# Patient Record
Sex: Female | Born: 1990
Health system: Southern US, Community
[De-identification: ages and names within clinical notes are randomized; demographics above are authoritative.]

---

## 2010-12-27 ENCOUNTER — Encounter: Payer: Self-pay | Admitting: Obstetrics and Gynecology

## 2012-05-08 ENCOUNTER — Other Ambulatory Visit: Payer: Self-pay

## 2012-08-30 ENCOUNTER — Other Ambulatory Visit (HOSPITAL_COMMUNITY): Payer: Self-pay | Admitting: Obstetrics and Gynecology

## 2012-08-30 DIAGNOSIS — IMO0002 Reserved for concepts with insufficient information to code with codable children: Secondary | ICD-10-CM

## 2012-08-31 ENCOUNTER — Ambulatory Visit (HOSPITAL_COMMUNITY): Payer: Self-pay | Attending: Obstetrics and Gynecology

## 2012-09-04 ENCOUNTER — Other Ambulatory Visit (HOSPITAL_COMMUNITY): Payer: Self-pay | Admitting: Obstetrics and Gynecology

## 2012-09-04 DIAGNOSIS — O368390 Maternal care for abnormalities of the fetal heart rate or rhythm, unspecified trimester, not applicable or unspecified: Secondary | ICD-10-CM

## 2012-09-06 ENCOUNTER — Encounter (HOSPITAL_COMMUNITY): Payer: Self-pay

## 2012-09-06 ENCOUNTER — Ambulatory Visit (HOSPITAL_COMMUNITY)
Admission: RE | Admit: 2012-09-06 | Discharge: 2012-09-06 | Disposition: A | Discharge status: Home / Self Care | Payer: Medicaid Other | Source: Ambulatory Visit | Attending: Obstetrics and Gynecology | Admitting: Obstetrics and Gynecology

## 2012-09-06 DIAGNOSIS — Z363 Encounter for antenatal screening for malformations: Secondary | ICD-10-CM | POA: Insufficient documentation

## 2012-09-06 DIAGNOSIS — Z1389 Encounter for screening for other disorder: Secondary | ICD-10-CM | POA: Insufficient documentation

## 2012-09-06 DIAGNOSIS — O368390 Maternal care for abnormalities of the fetal heart rate or rhythm, unspecified trimester, not applicable or unspecified: Secondary | ICD-10-CM

## 2012-09-06 DIAGNOSIS — O358XX0 Maternal care for other (suspected) fetal abnormality and damage, not applicable or unspecified: Secondary | ICD-10-CM | POA: Insufficient documentation

## 2012-09-06 NOTE — Progress Notes (Signed)
Ms. Linda Webb had an ultrasound appointment today.  Please see [facility]-OB/GYN report for details.  Comments  There is an active singleton fetus with no apparent structural defects or markers of aneuploidy on today's routine anatomic examination.  The biometry suggests a fetus with an EFW at the approximately 45th percentile for gestational age.   The patient is referred for evaluation of skipped heart beat heard on doptone in the office.   M-mode echocardiography was performed.    The fetal heart rate pattern on M-mode reveals intermittent premature atrial contractions. Premature atrial contractions (PAC's) are the most common fetal arrhythmia and are usually self-limited. Most resolve by the time of delivery and rarely progress to cause problems in the neonate. PACs are generally not associated with cardiac structural defects.  We discussed this is most often a benign finding which resolves prior to delivery, but rarely these can lead to SVT which is associated with decreased fetal movement. Therefore, we recommend the patient continue with kick counts and notify you if there is decreased fetal movement.  Clinical surveillance via fetal heart rate auscultation (Doptone) is recommended on a weekly basis.   Impression Active singleton fetus. Premature atrial contractions (PACs) Normal interval growth. Normal amniotic fluid volume  Recommendations 1. I recommend follow-up with Doptone of the fetal heart rate in the office at weekly prenatal visits. 2. Follow as clinically indicated with routine fetal kick counts. 3. Referral to MFM with FHR exceeding 170 bpm.  Marjo Bicker, Louann Sjogren, MD, MS, FACOG Assistant Professor Section of Maternal-Fetal Medicine [facility]

## 2014-10-07 ENCOUNTER — Encounter (HOSPITAL_COMMUNITY): Payer: Self-pay

## 2014-11-14 ENCOUNTER — Emergency Department (HOSPITAL_BASED_OUTPATIENT_CLINIC_OR_DEPARTMENT_OTHER): Payer: Medicaid Other

## 2014-11-14 ENCOUNTER — Encounter (HOSPITAL_BASED_OUTPATIENT_CLINIC_OR_DEPARTMENT_OTHER): Payer: Self-pay | Admitting: *Deleted

## 2014-11-14 ENCOUNTER — Other Ambulatory Visit (HOSPITAL_BASED_OUTPATIENT_CLINIC_OR_DEPARTMENT_OTHER): Payer: Medicaid Other

## 2014-11-14 ENCOUNTER — Emergency Department (HOSPITAL_BASED_OUTPATIENT_CLINIC_OR_DEPARTMENT_OTHER)
Admission: EM | Admit: 2014-11-14 | Discharge: 2014-11-14 | Disposition: A | Discharge status: Home / Self Care | Payer: Medicaid Other | Attending: Emergency Medicine | Admitting: Emergency Medicine

## 2014-11-14 DIAGNOSIS — B373 Candidiasis of vulva and vagina: Secondary | ICD-10-CM | POA: Diagnosis not present

## 2014-11-14 DIAGNOSIS — O2391 Unspecified genitourinary tract infection in pregnancy, first trimester: Secondary | ICD-10-CM | POA: Diagnosis not present

## 2014-11-14 DIAGNOSIS — Z3A01 Less than 8 weeks gestation of pregnancy: Secondary | ICD-10-CM | POA: Insufficient documentation

## 2014-11-14 DIAGNOSIS — N390 Urinary tract infection, site not specified: Secondary | ICD-10-CM

## 2014-11-14 DIAGNOSIS — R102 Pelvic and perineal pain: Secondary | ICD-10-CM

## 2014-11-14 DIAGNOSIS — R109 Unspecified abdominal pain: Secondary | ICD-10-CM

## 2014-11-14 DIAGNOSIS — B3731 Acute candidiasis of vulva and vagina: Secondary | ICD-10-CM

## 2014-11-14 DIAGNOSIS — O26891 Other specified pregnancy related conditions, first trimester: Secondary | ICD-10-CM

## 2014-11-14 DIAGNOSIS — Z3490 Encounter for supervision of normal pregnancy, unspecified, unspecified trimester: Secondary | ICD-10-CM

## 2014-11-14 DIAGNOSIS — O9989 Other specified diseases and conditions complicating pregnancy, childbirth and the puerperium: Secondary | ICD-10-CM | POA: Insufficient documentation

## 2014-11-14 LAB — BASIC METABOLIC PANEL
Anion gap: 15 (ref 5–15)
BUN: 13 mg/dL (ref 6–23)
CALCIUM: 8.7 mg/dL (ref 8.4–10.5)
CO2: 20 meq/L (ref 19–32)
Chloride: 102 mEq/L (ref 96–112)
Creatinine, Ser: 0.6 mg/dL (ref 0.50–1.10)
GFR calc Af Amer: >90 mL/min (ref >90)
GFR calc non Af Amer: >90 mL/min (ref >90)
Glucose, Bld: 77 mg/dL (ref 70–99)
Potassium: 3.9 mEq/L (ref 3.7–5.3)
SODIUM: 137 meq/L (ref 137–147)

## 2014-11-14 LAB — PREGNANCY, URINE: PREG TEST UR: POSITIVE — AB

## 2014-11-14 LAB — CBC WITH DIFFERENTIAL/PLATELET
Basophils Absolute: 0 10*3/uL (ref 0.0–0.1)
Basophils Relative: 0 % (ref 0–1)
EOS PCT: 1 % (ref 0–5)
Eosinophils Absolute: 0.1 10*3/uL (ref 0.0–0.7)
HEMATOCRIT: 29.9 % — AB (ref 36.0–46.0)
Hemoglobin: 9.4 g/dL — ABNORMAL LOW (ref 12.0–15.0)
LYMPHS PCT: 39 % (ref 12–46)
Lymphs Abs: 3.1 10*3/uL (ref 0.7–4.0)
MCH: 23.5 pg — ABNORMAL LOW (ref 26.0–34.0)
MCHC: 31.4 g/dL (ref 30.0–36.0)
MCV: 74.8 fL — AB (ref 78.0–100.0)
MONO ABS: 0.8 10*3/uL (ref 0.1–1.0)
Monocytes Relative: 10 % (ref 3–12)
Neutro Abs: 4 10*3/uL (ref 1.7–7.7)
Neutrophils Relative %: 50 % (ref 43–77)
Platelets: 214 10*3/uL (ref 150–400)
RBC: 4 MIL/uL (ref 3.87–5.11)
RDW: 16.9 % — ABNORMAL HIGH (ref 11.5–15.5)
WBC: 8 10*3/uL (ref 4.0–10.5)

## 2014-11-14 LAB — HCG, QUANTITATIVE, PREGNANCY: HCG, BETA CHAIN, QUANT, S: 34518 m[IU]/mL — AB (ref <5)

## 2014-11-14 LAB — URINE MICROSCOPIC-ADD ON

## 2014-11-14 LAB — WET PREP, GENITAL: Trich, Wet Prep: NONE SEEN

## 2014-11-14 LAB — URINALYSIS, ROUTINE W REFLEX MICROSCOPIC
Glucose, UA: NEGATIVE mg/dL
Hgb urine dipstick: NEGATIVE
KETONES UR: 15 mg/dL — AB
NITRITE: NEGATIVE
Protein, ur: NEGATIVE mg/dL
Specific Gravity, Urine: 1.029 (ref 1.005–1.030)
UROBILINOGEN UA: 1 mg/dL (ref 0.0–1.0)
pH: 6 (ref 5.0–8.0)

## 2014-11-14 MED ORDER — PRENATAL COMPLETE 14-0.4 MG PO TABS: 1.0000 | ORAL_TABLET | Freq: Every day | ORAL | Status: DC

## 2014-11-14 MED ORDER — CEPHALEXIN 500 MG PO CAPS: 500.0000 mg | ORAL_CAPSULE | Freq: Four times a day (QID) | ORAL | Status: DC

## 2014-11-14 MED ORDER — MICONAZOLE NITRATE 2 % EX CREA
1.0000 | TOPICAL_CREAM | Freq: Two times a day (BID) | CUTANEOUS | Status: DC
Start: 2014-11-14 — End: 2019-07-13

## 2014-11-14 NOTE — ED Provider Notes (Signed)
CSN: [id number]     Arrival date & time 11/14/14  1937 History   First MD Initiated Contact with Patient 11/14/14 2028     Chief Complaint  Patient presents with  . Abdominal Pain     (Consider location/radiation/quality/duration/timing/severity/associated sxs/prior Treatment) HPI Comments: Patient is a 23 year old female with no past medical history who presents with abdominal pain that started a few hours ago. The pain is located in her lower abdomen and does not radiate. The pain is described as aching and moderate. The pain started gradually and progressively worsened since the onset. No alleviating/aggravating factors. The patient has tried nothing for symptoms without relief. Associated symptoms include nothing. Patient denies fever, headache, NVD, chest pain, SOB, dysuria, constipation, abnormal vaginal bleeding/discharge. LMP 2 months ago.      History reviewed. No pertinent past medical history. History reviewed. No pertinent past surgical history. No family history on file. History  Substance Use Topics  . Smoking status: Never Smoker   . Smokeless tobacco: Never Used  . Alcohol Use: No   OB History    Gravida Para Term Preterm AB TAB SAB Ectopic Multiple Living   3 2 0 0 0 0 0 0 0 2      Review of Systems  Gastrointestinal: Positive for abdominal pain.  All other systems reviewed and are negative.     Allergies  Review of patient's allergies indicates no known allergies.  Home Medications   Prior to Admission medications   Medication Sig Start Date End Date Taking? Authorizing Provider  Prenatal Vit w/Fe-Methylfol-FA (PNV PO) Take by mouth.    Historical Provider, MD   BP 113/77 mmHg  Pulse 92  Temp(Src) 98.4 F (36.9 C) (Oral)  Resp 18  Ht 5\' 5"  (1.651 m)  Wt 120 lb (54.432 kg)  BMI 19.97 kg/m2  SpO2 99%  LMP 09/16/2014  Breastfeeding? Unknown Physical Exam  Constitutional: She is oriented to person, place, and time. She appears well-developed and  well-nourished. No distress.  HENT:  Head: Normocephalic and atraumatic.  Eyes: Conjunctivae are normal.  Neck: Normal range of motion.  Cardiovascular: Normal rate and regular rhythm.  Exam reveals no gallop and no friction rub.   No murmur heard. Pulmonary/Chest: Effort normal and breath sounds normal. She has no wheezes. She has no rales. She exhibits no tenderness.  Abdominal: Soft. She exhibits no distension. There is tenderness. There is no rebound and no guarding.  Lower central abdominal tenderness to palpation. No other focal tenderness.   Genitourinary: Vagina normal.  Copious thick white vaginal discharge. Cervical os closed. No CMT. Central tenderness to palpation on bimanual exam. No adnexal tenderness.   Musculoskeletal: Normal range of motion.  Neurological: She is alert and oriented to person, place, and time. Coordination normal.  Speech is goal-oriented. Moves limbs without ataxia.   Skin: Skin is warm and dry.  Psychiatric: She has a normal mood and affect. Her behavior is normal.  Nursing note and vitals reviewed.   ED Course  Procedures (including critical care time) Labs Review Labs Reviewed  WET PREP, GENITAL - Abnormal; Notable for the following:    Yeast Wet Prep HPF POC MODERATE (*)    Clue Cells Wet Prep HPF POC FEW (*)    WBC, Wet Prep HPF POC MODERATE (*)    All other components within normal limits  PREGNANCY, URINE - Abnormal; Notable for the following:    Preg Test, Ur POSITIVE (*)    All other components within normal  limits  URINALYSIS, ROUTINE W REFLEX MICROSCOPIC - Abnormal; Notable for the following:    Bilirubin Urine SMALL (*)    Ketones, ur 15 (*)    Leukocytes, UA SMALL (*)    All other components within normal limits  URINE MICROSCOPIC-ADD ON - Abnormal; Notable for the following:    Bacteria, UA MANY (*)    All other components within normal limits  CBC WITH DIFFERENTIAL - Abnormal; Notable for the following:    Hemoglobin 9.4 (*)     HCT 29.9 (*)    MCV 74.8 (*)    MCH 23.5 (*)    RDW 16.9 (*)    All other components within normal limits  HCG, QUANTITATIVE, PREGNANCY - Abnormal; Notable for the following:    hCG, Beta Chain, Quant, S [id number] (*)    All other components within normal limits  GC/CHLAMYDIA PROBE AMP  BASIC METABOLIC PANEL    Imaging Review [place] Ob Comp Less 14 Wks  11/14/2014   CLINICAL DATA:  Central pelvic pain for 16 hr. Estimated gestational age by LMP is 8 weeks 3 days. Quantitative beta HCG is not available.  EXAM: OBSTETRIC <14 WK ULTRASOUND  TECHNIQUE: Transabdominal ultrasound was performed for evaluation of the gestation as well as the maternal uterus and adnexal regions.  COMPARISON:  None.  FINDINGS: Intrauterine gestational sac: Single intrauterine pregnancy identified.  Yolk sac:  Yolk sac is present.  Embryo:  Fetal pole is identified.  Cardiac Activity: Fetal cardiac activity is visualized.  Heart Rate: 118 bpm  CRL:   6  mm   6 w 3 d                  US EDC: 07/05/2014  Maternal uterus/adnexae: The uterus appears anteverted. No myometrial mass lesions identified. Crescentic fluid collection adjacent to the inferior gestational sac consistent with small to moderate subchorionic hemorrhage. Both ovaries are visualized and appear normal. No abnormal adnexal masses. No free pelvic fluid collections.  IMPRESSION: Single living intrauterine pregnancy. Estimated gestational age by crown-rump length is 6 weeks 3 days. Small to moderate-sized subchorionic hemorrhage.   Electronically Signed   By: Burman NievesWilliam  Stevens M.D.   On: 11/14/2014 22:05     EKG Interpretation None      MDM   Final diagnoses:  Pregnancy  Abdominal pain  Vaginal yeast infection  UTI (lower urinary tract infection)    10:02 PM Urine shows positive pregnancy. Urinalysis shows UTI. Remaining labs unremarkable for acute changes. HCG 34,518. US ob pending.   US shows 1 IUP with small subchorionic hemorrhage. Patient advised to  [facility] for hcg recheck. Vitals stable and patient afebrile. Patient will have keflex for UTI.    Emilia BeckKaitlyn Lillee Mooneyhan, PA-C 11/15/14 1708  Arby BarretteMarcy Pfeiffer, MD 11/19/14 1106

## 2014-11-14 NOTE — ED Notes (Signed)
Mid abd pain since 0430- denies vaginal and urinary sx- last BM on Tuesday

## 2014-11-14 NOTE — Discharge Instructions (Signed)
Take prenatal vitamins daily. Use miconazole cream around the vagina area but do not put inside the vagina. [facility] for a recheck. Your pregnancy hormone in your blood is currently measured at 34, 518. [facility] with worsening or concerning symptoms.

## 2014-11-14 NOTE — ED Notes (Signed)
Patient transported to u/s via wheelchair per tech.

## 2014-11-15 LAB — GC/CHLAMYDIA PROBE AMP
CT Probe RNA: NEGATIVE
GC Probe RNA: NEGATIVE

## 2015-10-27 ENCOUNTER — Emergency Department (HOSPITAL_BASED_OUTPATIENT_CLINIC_OR_DEPARTMENT_OTHER)
Admission: EM | Admit: 2015-10-27 | Discharge: 2015-10-27 | Disposition: A | Discharge status: Home / Self Care | Payer: No Typology Code available for payment source | Attending: Emergency Medicine | Admitting: Emergency Medicine

## 2015-10-27 ENCOUNTER — Encounter (HOSPITAL_BASED_OUTPATIENT_CLINIC_OR_DEPARTMENT_OTHER): Payer: Self-pay | Admitting: Emergency Medicine

## 2015-10-27 DIAGNOSIS — Z792 Long term (current) use of antibiotics: Secondary | ICD-10-CM | POA: Insufficient documentation

## 2015-10-27 DIAGNOSIS — Z79899 Other long term (current) drug therapy: Secondary | ICD-10-CM | POA: Diagnosis not present

## 2015-10-27 DIAGNOSIS — Y998 Other external cause status: Secondary | ICD-10-CM | POA: Diagnosis not present

## 2015-10-27 DIAGNOSIS — Y92410 Unspecified street and highway as the place of occurrence of the external cause: Secondary | ICD-10-CM | POA: Insufficient documentation

## 2015-10-27 DIAGNOSIS — S29001A Unspecified injury of muscle and tendon of front wall of thorax, initial encounter: Secondary | ICD-10-CM | POA: Insufficient documentation

## 2015-10-27 DIAGNOSIS — Y9389 Activity, other specified: Secondary | ICD-10-CM | POA: Insufficient documentation

## 2015-10-27 NOTE — Discharge Instructions (Signed)
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
## 2016-12-16 ENCOUNTER — Emergency Department (HOSPITAL_BASED_OUTPATIENT_CLINIC_OR_DEPARTMENT_OTHER)
Admission: EM | Admit: 2016-12-16 | Discharge: 2016-12-17 | Disposition: A | Discharge status: Home / Self Care | Payer: Medicaid Other | Attending: Emergency Medicine | Admitting: Emergency Medicine

## 2016-12-16 ENCOUNTER — Encounter (HOSPITAL_BASED_OUTPATIENT_CLINIC_OR_DEPARTMENT_OTHER): Payer: Self-pay | Admitting: *Deleted

## 2016-12-16 DIAGNOSIS — Z79899 Other long term (current) drug therapy: Secondary | ICD-10-CM | POA: Insufficient documentation

## 2016-12-16 DIAGNOSIS — A599 Trichomoniasis, unspecified: Secondary | ICD-10-CM

## 2016-12-16 DIAGNOSIS — O26891 Other specified pregnancy related conditions, first trimester: Secondary | ICD-10-CM

## 2016-12-16 DIAGNOSIS — B9689 Other specified bacterial agents as the cause of diseases classified elsewhere: Secondary | ICD-10-CM

## 2016-12-16 DIAGNOSIS — A5901 Trichomonal vulvovaginitis: Secondary | ICD-10-CM | POA: Insufficient documentation

## 2016-12-16 DIAGNOSIS — N760 Acute vaginitis: Secondary | ICD-10-CM

## 2016-12-16 DIAGNOSIS — Z3A09 9 weeks gestation of pregnancy: Secondary | ICD-10-CM | POA: Insufficient documentation

## 2016-12-16 DIAGNOSIS — R109 Unspecified abdominal pain: Secondary | ICD-10-CM

## 2016-12-16 LAB — URINALYSIS, ROUTINE W REFLEX MICROSCOPIC
Glucose, UA: NEGATIVE mg/dL
Hgb urine dipstick: NEGATIVE
Ketones, ur: NEGATIVE mg/dL
NITRITE: NEGATIVE
Protein, ur: NEGATIVE mg/dL
Specific Gravity, Urine: 1.028 (ref 1.005–1.030)
pH: 6 (ref 5.0–8.0)

## 2016-12-16 LAB — PREGNANCY, URINE: PREG TEST UR: POSITIVE — AB

## 2016-12-16 LAB — URINALYSIS, MICROSCOPIC (REFLEX)

## 2016-12-16 NOTE — ED Provider Notes (Signed)
[facility] Provider Note   CSN: [id number] Arrival date & time: 12/16/16  2224  By signing my name below, I, Alyssa Grove, attest that this documentation has been prepared under the direction and in the presence of Melene Plan, DO. Electronically Signed: Alyssa Grove, ED Scribe. 12/16/16. 12:52 AM.   History   Chief Complaint Chief Complaint  Patient presents with  . Abdominal Pain    She is unaware of LNMP, but believes it was either 10/2016 or 12/ 2017. Pt had a positive home pregnancy test. Pt has been pregnant 5x before with all vaginal deliveries with no complications.    The history is provided by the patient. No language interpreter was used.  Abdominal Pain   This is a new problem. Episode onset: 4 days. The problem occurs constantly. The problem has not changed since onset.The pain is located in the suprapubic region. The quality of the pain is sharp. The pain is moderate. Pertinent negatives include fever, nausea, vomiting, dysuria, hematuria, headaches, arthralgias and myalgias.   History reviewed. No pertinent past medical history.  There are no active problems to display for this patient.  History reviewed. No pertinent surgical history.  OB History    Gravida Para Term Preterm AB Living   3 2 0 0 0 2   SAB TAB Ectopic Multiple Live Births   0 0 0 0        Home Medications    Prior to Admission medications   Medication Sig Start Date End Date Taking? Authorizing Provider  cephALEXin (KEFLEX) 500 MG capsule Take 1 capsule (500 mg total) by mouth 4 (four) times daily. 11/14/14   Kaitlyn Szekalski, PA-C  metroNIDAZOLE (FLAGYL) 500 MG tablet Take 1 tablet (500 mg total) by mouth 2 (two) times daily. One po bid x 7 days 12/17/16   Melene Plan, DO  miconazole (MICATIN) 2 % cream Apply 1 application topically 2 (two) times daily. 11/14/14   Emilia Beck, PA-C  Prenatal Vit w/Fe-Methylfol-FA (PNV PO) Take by mouth.    Historical Provider, MD  Prenatal  Vit-Fe Fumarate-FA (PRENATAL COMPLETE) 14-0.4 MG TABS Take 1 tablet by mouth daily. 11/14/14   Emilia Beck, PA-C    Family History No family history on file.  Social History Social History  Substance Use Topics  . Smoking status: Never Smoker  . Smokeless tobacco: Never Used  . Alcohol use No     Allergies   Patient has no known allergies.   Review of Systems Review of Systems  Constitutional: Negative for chills and fever.  HENT: Negative for congestion and rhinorrhea.   Eyes: Negative for redness and visual disturbance.  Respiratory: Negative for shortness of breath and wheezing.   Cardiovascular: Negative for chest pain and palpitations.  Gastrointestinal: Positive for abdominal pain. Negative for nausea and vomiting.  Genitourinary: Negative for difficulty urinating, dysuria, hematuria, urgency, vaginal bleeding and vaginal discharge.  Musculoskeletal: Negative for arthralgias and myalgias.  Skin: Negative for pallor and wound.  Neurological: Negative for dizziness and headaches.  All other systems reviewed and are negative.  Physical Exam Updated Vital Signs BP 108/74   Pulse 78   Temp 97.6 F (36.4 C) (Oral)   Resp 18   Ht 5\' 5"  (1.651 m)   Wt 114 lb (51.7 kg)   LMP 10/06/2016   SpO2 100%   BMI 18.97 kg/m   Physical Exam  Constitutional: She is oriented to person, place, and time. She appears well-developed and well-nourished. She is active. No  distress.  HENT:  Head: Normocephalic and atraumatic.  Eyes: Conjunctivae are normal.  Cardiovascular: Normal rate.   Pulmonary/Chest: Effort normal. No respiratory distress.  Abdominal: She exhibits no distension and no mass. There is tenderness. There is no guarding.  Mild suprapubic tenderness  Genitourinary: Uterus is enlarged. Uterus is not tender. Cervix exhibits discharge (yellowish, thick) and friability. Cervix exhibits no motion tenderness. Right adnexum displays no mass, no tenderness and no  fullness. Left adnexum displays no mass, no tenderness and no fullness.  Genitourinary Comments: Fingertip dilation  Musculoskeletal: Normal range of motion.  Neurological: She is alert and oriented to person, place, and time.  Skin: Skin is warm and dry.  Psychiatric: She has a normal mood and affect. Her behavior is normal.  Nursing note and vitals reviewed.  ED Treatments / Results  DIAGNOSTIC STUDIES: Oxygen Saturation is 100% on RA, normal by my interpretation.    COORDINATION OF CARE: 11:23 PM Discussed treatment plan with pt at bedside which includes Pelvic exam and pt agreed to plan.  Labs (all labs ordered are listed, but only abnormal results are displayed) Labs Reviewed  WET PREP, GENITAL - Abnormal; Notable for the following:       Result Value   Trich, Wet Prep PRESENT (*)    Clue Cells Wet Prep HPF POC PRESENT (*)    WBC, Wet Prep HPF POC MANY (*)    All other components within normal limits  PREGNANCY, URINE - Abnormal; Notable for the following:    Preg Test, Ur POSITIVE (*)    All other components within normal limits  URINALYSIS, ROUTINE W REFLEX MICROSCOPIC - Abnormal; Notable for the following:    APPearance CLOUDY (*)    Bilirubin Urine SMALL (*)    Leukocytes, UA MODERATE (*)    All other components within normal limits  URINALYSIS, MICROSCOPIC (REFLEX) - Abnormal; Notable for the following:    Bacteria, UA FEW (*)    Squamous Epithelial / LPF 6-30 (*)    All other components within normal limits  RPR  HIV ANTIBODY (ROUTINE TESTING)  GC/CHLAMYDIA PROBE AMP ([place]) NOT AT [facility]    EKG  EKG Interpretation None       Radiology No results found.  Procedures Procedures (including critical care time) EMERGENCY DEPARTMENT US PREGNANCY "Study: Limited Ultrasound of the Pelvis"  INDICATIONS:Pregnancy(required) and Pelvic pain Multiple views of the uterus and pelvic cavity are obtained with a multi-frequency probe.  APPROACH:Transabdominal     PERFORMED BY: Myself  IMAGES ARCHIVED?: Yes  LIMITATIONS: Decompressed bladder  PREGNANCY FREE FLUID: Small  PREGNANCY UTERUS FINDINGS:Uterus enlarged, Gestational sac noted and Non-specific intrauterine fluid noted ADNEXAL FINDINGS:Right ovarion cyst and Left ovary not seen  PREGNANCY FINDINGS: Intrauterine gestational sac noted, Yolk sac noted and No fetal heart activity seen  INTERPRETATION: Viable intrauterine pregnancy  GESTATIONAL AGE, ESTIMATE: 4wk 6 days  Medications Ordered in ED Medications  cefTRIAXone (ROCEPHIN) injection 250 mg (not administered)  azithromycin (ZITHROMAX) tablet 1,000 mg (not administered)  metroNIDAZOLE (FLAGYL) tablet 500 mg (not administered)     Initial Impression / Assessment and Plan / ED Course  I have reviewed the triage vital signs and the nursing notes.  Pertinent labs & imaging results that were available during my care of the patient were reviewed by me and considered in my medical decision making (see chart for details).  Clinical Course     26 yo F with a cc of suprapubic pain.  Going on for past couple of  days.  G6P5.  Bedside US with GS and YS.  UA without infection.  Sent off for STD's.  OB follow up.   + for trich, will cover GC.  D/c home.   12:52 AM:  I have discussed the diagnosis/risks/treatment options with the patient and family and believe the pt to be eligible for discharge home to follow-up with OB. We also discussed returning to the ED immediately if new or worsening sx occur. We discussed the sx which are most concerning (e.g., sudden worsening pain, fever, inability to tolerate by mouth, vaginal bleeding, discharge) that necessitate immediate return. Medications administered to the patient during their visit and any new prescriptions provided to the patient are listed below.  Medications given during this visit Medications  cefTRIAXone (ROCEPHIN) injection 250 mg (not administered)  azithromycin (ZITHROMAX)  tablet 1,000 mg (not administered)  metroNIDAZOLE (FLAGYL) tablet 500 mg (not administered)     The patient appears reasonably screen and/or stabilized for discharge and I doubt any other medical condition or other [facility] requiring further screening, evaluation, or treatment in the ED at this time prior to discharge.     Final Clinical Impressions(s) / ED Diagnoses   Final diagnoses:  Trichomoniasis  Abdominal pain during pregnancy, first trimester  BV (bacterial vaginosis)    New Prescriptions New Prescriptions   METRONIDAZOLE (FLAGYL) 500 MG TABLET    Take 1 tablet (500 mg total) by mouth 2 (two) times daily. One po bid x 7 days   I personally performed the services described in this documentation, which was scribed in my presence. The recorded information has been reviewed and is accurate.      Melene Plan, DO 12/17/16 [phone]

## 2016-12-16 NOTE — ED Triage Notes (Signed)
Stabbing pain in her umbilicus. She had a positive home pregnancy test.

## 2016-12-17 LAB — WET PREP, GENITAL
SPERM: NONE SEEN
Yeast Wet Prep HPF POC: NONE SEEN

## 2016-12-17 LAB — GC/CHLAMYDIA PROBE AMP (~~LOC~~) NOT AT ARMC
Chlamydia: NEGATIVE
Neisseria Gonorrhea: NEGATIVE

## 2016-12-17 MED ORDER — AZITHROMYCIN 250 MG PO TABS
1000.0000 mg | ORAL_TABLET | Freq: Once | ORAL | Status: AC
  Administered 2016-12-17: 1000 mg via ORAL
  Filled 2016-12-17: qty 4

## 2016-12-17 MED ORDER — CEFTRIAXONE SODIUM 250 MG IJ SOLR
250.0000 mg | Freq: Once | INTRAMUSCULAR | Status: AC
  Administered 2016-12-17: 250 mg via INTRAMUSCULAR
  Filled 2016-12-17: qty 250

## 2016-12-17 MED ORDER — METRONIDAZOLE 500 MG PO TABS: 500.0000 mg | ORAL_TABLET | Freq: Two times a day (BID) | ORAL | 0 refills | Status: DC

## 2016-12-17 MED ORDER — METRONIDAZOLE 500 MG PO TABS
500.0000 mg | ORAL_TABLET | Freq: Once | ORAL | Status: AC
  Administered 2016-12-17: 500 mg via ORAL
  Filled 2016-12-17: qty 1

## 2016-12-17 NOTE — Discharge Instructions (Signed)
Follow up with your OB. Return for vaginal bleeding, discharge, sudden worsening pain.

## 2016-12-18 LAB — HIV ANTIBODY (ROUTINE TESTING W REFLEX): HIV Screen 4th Generation wRfx: NONREACTIVE

## 2016-12-18 LAB — RPR: RPR: NONREACTIVE

## 2017-03-01 ENCOUNTER — Emergency Department (HOSPITAL_BASED_OUTPATIENT_CLINIC_OR_DEPARTMENT_OTHER)
Admission: EM | Admit: 2017-03-01 | Discharge: 2017-03-02 | Disposition: A | Discharge status: Home / Self Care | Payer: Medicaid Other | Attending: Emergency Medicine | Admitting: Emergency Medicine

## 2017-03-01 ENCOUNTER — Encounter (HOSPITAL_BASED_OUTPATIENT_CLINIC_OR_DEPARTMENT_OTHER): Payer: Self-pay

## 2017-03-01 DIAGNOSIS — O23592 Infection of other part of genital tract in pregnancy, second trimester: Secondary | ICD-10-CM | POA: Insufficient documentation

## 2017-03-01 DIAGNOSIS — A599 Trichomoniasis, unspecified: Secondary | ICD-10-CM

## 2017-03-01 DIAGNOSIS — B9689 Other specified bacterial agents as the cause of diseases classified elsewhere: Secondary | ICD-10-CM

## 2017-03-01 DIAGNOSIS — R3121 Asymptomatic microscopic hematuria: Secondary | ICD-10-CM

## 2017-03-01 DIAGNOSIS — A5901 Trichomonal vulvovaginitis: Secondary | ICD-10-CM | POA: Insufficient documentation

## 2017-03-01 DIAGNOSIS — N898 Other specified noninflammatory disorders of vagina: Secondary | ICD-10-CM | POA: Insufficient documentation

## 2017-03-01 DIAGNOSIS — N760 Acute vaginitis: Secondary | ICD-10-CM

## 2017-03-01 DIAGNOSIS — Z3A15 15 weeks gestation of pregnancy: Secondary | ICD-10-CM | POA: Insufficient documentation

## 2017-03-01 LAB — CBC WITH DIFFERENTIAL/PLATELET
Basophils Absolute: 0 10*3/uL (ref 0.0–0.1)
Basophils Relative: 0 %
Eosinophils Absolute: 0.2 10*3/uL (ref 0.0–0.7)
Eosinophils Relative: 1 %
HCT: 29.8 % — ABNORMAL LOW (ref 36.0–46.0)
HEMOGLOBIN: 10.2 g/dL — AB (ref 12.0–15.0)
LYMPHS ABS: 2.9 10*3/uL (ref 0.7–4.0)
LYMPHS PCT: 27 %
MCH: 28.7 pg (ref 26.0–34.0)
MCHC: 34.2 g/dL (ref 30.0–36.0)
MCV: 83.9 fL (ref 78.0–100.0)
MONOS PCT: 10 %
Monocytes Absolute: 1.1 10*3/uL — ABNORMAL HIGH (ref 0.1–1.0)
NEUTROS ABS: 6.6 10*3/uL (ref 1.7–7.7)
NEUTROS PCT: 62 %
Platelets: 233 10*3/uL (ref 150–400)
RBC: 3.55 MIL/uL — AB (ref 3.87–5.11)
RDW: 15 % (ref 11.5–15.5)
WBC: 10.7 10*3/uL — AB (ref 4.0–10.5)

## 2017-03-01 LAB — WET PREP, GENITAL
Sperm: NONE SEEN
YEAST WET PREP: NONE SEEN

## 2017-03-01 LAB — URINALYSIS, ROUTINE W REFLEX MICROSCOPIC
Bilirubin Urine: NEGATIVE
GLUCOSE, UA: NEGATIVE mg/dL
Ketones, ur: NEGATIVE mg/dL
Nitrite: NEGATIVE
PH: 6.5 (ref 5.0–8.0)
Protein, ur: 100 mg/dL — AB
Specific Gravity, Urine: 1.023 (ref 1.005–1.030)

## 2017-03-01 LAB — BASIC METABOLIC PANEL
ANION GAP: 8 (ref 5–15)
BUN: 13 mg/dL (ref 6–20)
CALCIUM: 8.7 mg/dL — AB (ref 8.9–10.3)
CHLORIDE: 105 mmol/L (ref 101–111)
CO2: 21 mmol/L — AB (ref 22–32)
Creatinine, Ser: 0.43 mg/dL — ABNORMAL LOW (ref 0.44–1.00)
GFR calc non Af Amer: >60 mL/min (ref >60)
GLUCOSE: 80 mg/dL (ref 65–99)
Potassium: 3.4 mmol/L — ABNORMAL LOW (ref 3.5–5.1)
Sodium: 134 mmol/L — ABNORMAL LOW (ref 135–145)

## 2017-03-01 LAB — URINALYSIS, MICROSCOPIC (REFLEX)

## 2017-03-01 LAB — PREGNANCY, URINE: Preg Test, Ur: POSITIVE — AB

## 2017-03-01 LAB — HCG, QUANTITATIVE, PREGNANCY: hCG, Beta Chain, Quant, S: 38098 m[IU]/mL — ABNORMAL HIGH (ref <5)

## 2017-03-01 NOTE — ED Provider Notes (Signed)
[facility]   CSN: [id number] Arrival date & time: 03/01/17  2149   By signing my name below, I, Linda Webb, attest that this documentation has been prepared under the direction and in the presence of Roxy Horseman, PA-C. Electronically Signed: Clarisse Webb, Scribe. 03/01/17. 11:17 PM.   History   Chief Complaint Chief Complaint  Patient presents with  . Hematuria   The history is provided by the patient and medical records. No language interpreter was used.    HPI Comments: Linda Webb is a 26 y.o. female who presents to the Emergency Department complaining of bright pink bleeding from the vagina today. Pt reportedly noticed the blood while urinating and she states she is unsure if it is d/t vaginal bleeding vs hematuria. She notes associated stabbing lower abdominal pain onset today and white vaginal discharge x 2 days; triage notes intermittent bladder pressure. Pt notes she has not seen an OB/GYN yet because she is in the process of acquiring medicaid. Pt reportedly [redacted] weeks pregnant. Pt denies dysuria. G6/P5/A0. LNMP 11/2016.  History reviewed. No pertinent past medical history.  There are no active problems to display for this patient.   History reviewed. No pertinent surgical history.  OB History    Gravida Para Term Preterm AB Living   4 2 0 0 0 2   SAB TAB Ectopic Multiple Live Births   0 0 0 0         Home Medications    Prior to Admission medications   Medication Sig Start Date End Date Taking? Authorizing Provider  cephALEXin (KEFLEX) 500 MG capsule Take 1 capsule (500 mg total) by mouth 4 (four) times daily. 11/14/14   Kaitlyn Szekalski, PA-C  metroNIDAZOLE (FLAGYL) 500 MG tablet Take 1 tablet (500 mg total) by mouth 2 (two) times daily. One po bid x 7 days 12/17/16   Melene Plan, DO  miconazole (MICATIN) 2 % cream Apply 1 application topically 2 (two) times daily. Patient not taking: Reported on 03/01/2017 11/14/14   Emilia Beck, PA-C  Prenatal Vit w/Fe-Methylfol-FA (PNV PO) Take by mouth.    Historical Provider, MD  Prenatal Vit-Fe Fumarate-FA (PRENATAL COMPLETE) 14-0.4 MG TABS Take 1 tablet by mouth daily. Patient not taking: Reported on 03/01/2017 11/14/14   Emilia Beck, PA-C    Family History No family history on file.  Social History Social History  Substance Use Topics  . Smoking status: Never Smoker  . Smokeless tobacco: Never Used  . Alcohol use No     Allergies   Patient has no known allergies.   Review of Systems Review of Systems  Gastrointestinal: Positive for abdominal pain.  Genitourinary: Positive for pelvic pain, vaginal bleeding and vaginal discharge. Negative for dysuria.  All other systems reviewed and are negative.    Physical Exam Updated Vital Signs BP 124/87   Pulse 94   Temp 98.5 F (36.9 C) (Oral)   Resp 18   Ht 5\' 5"  (1.651 m)   Wt 115 lb (52.2 kg)   LMP 11/09/2016   SpO2 100%   BMI 19.14 kg/m   Physical Exam  Constitutional: She is oriented to person, place, and time. She appears well-developed and well-nourished.  HENT:  Head: Normocephalic and atraumatic.  Eyes: Conjunctivae and EOM are normal. Pupils are equal, round, and reactive to light.  Neck: Normal range of motion. Neck supple.  Cardiovascular: Normal rate and regular rhythm.  Exam reveals no gallop and no friction rub.   No  murmur heard. Pulmonary/Chest: Effort normal and breath sounds normal. No respiratory distress. She has no wheezes. She has no rales. She exhibits no tenderness.  Abdominal: Soft. Bowel sounds are normal. She exhibits no distension and no mass. There is no tenderness. There is no rebound and no guarding.  Genitourinary:  Genitourinary Comments: Pelvic exam chaperoned by female ER tech, no right or left adnexal tenderness, no uterine tenderness, moderate white vaginal discharge, no bleeding, no CMT or friability, no foreign body, no injury to the external genitalia,  no other significant findings   Musculoskeletal: Normal range of motion. She exhibits no edema or tenderness.  Neurological: She is alert and oriented to person, place, and time.  Skin: Skin is warm and dry.  Psychiatric: She has a normal mood and affect. Her behavior is normal. Judgment and thought content normal.  Nursing note and vitals reviewed.    ED Treatments / Results  DIAGNOSTIC STUDIES: Oxygen Saturation is 100% on RA, normal by my interpretation.    COORDINATION OF CARE: 11:04 PM Discussed treatment plan with pt at bedside and pt agreed to plan. Pt prepared for pelvic exam.  Labs (all labs ordered are listed, but only abnormal results are displayed) Labs Reviewed  WET PREP, GENITAL - Abnormal; Notable for the following:       Result Value   Trich, Wet Prep PRESENT (*)    Clue Cells Wet Prep HPF POC PRESENT (*)    WBC, Wet Prep HPF POC MANY (*)    All other components within normal limits  URINALYSIS, ROUTINE W REFLEX MICROSCOPIC - Abnormal; Notable for the following:    APPearance CLOUDY (*)    Hgb urine dipstick LARGE (*)    Protein, ur 100 (*)    Leukocytes, UA LARGE (*)    All other components within normal limits  PREGNANCY, URINE - Abnormal; Notable for the following:    Preg Test, Ur POSITIVE (*)    All other components within normal limits  URINALYSIS, MICROSCOPIC (REFLEX) - Abnormal; Notable for the following:    Bacteria, UA FEW (*)    Squamous Epithelial / LPF 0-5 (*)    All other components within normal limits  HCG, QUANTITATIVE, PREGNANCY - Abnormal; Notable for the following:    hCG, Beta Chain, Quant, S 38,098 (*)    All other components within normal limits  CBC WITH DIFFERENTIAL/PLATELET - Abnormal; Notable for the following:    WBC 10.7 (*)    RBC 3.55 (*)    Hemoglobin 10.2 (*)    HCT 29.8 (*)    Monocytes Absolute 1.1 (*)    All other components within normal limits  BASIC METABOLIC PANEL - Abnormal; Notable for the following:    Sodium  134 (*)    Potassium 3.4 (*)    CO2 21 (*)    Creatinine, Ser 0.43 (*)    Calcium 8.7 (*)    All other components within normal limits  URINE CULTURE  ABO/RH    EKG  EKG Interpretation None       Radiology No results found.  Procedures Procedures (including critical care time)  Medications Ordered in ED Medications - No data to display   Initial Impression / Assessment and Plan / ED Course  I have reviewed the triage vital signs and the nursing notes.  Pertinent labs & imaging results that were available during my care of the patient were reviewed by me and considered in my medical decision making (see chart for details).  Patient with hematuria. She is pregnant. Pelvic exam performed, and she does not have any vaginal bleeding.  Patient does have Trichomonas, I suspect this is the cause the patient's hematuria. I will treat with Flagyl. She also has bacterial vaginosis. I discussed this treatment plan with Dr. Elesa MassedWard, who agrees, and states that this is standard of care, and that benefits out reviewed the risks.  Patient advised to follow-up with OB/GYN. She was also given care management phone number. She has no abdominal pain.  Patient refuses treatment for gonorrhea and chlamydia at this time, and will wait for culture.  Final Clinical Impressions(s) / ED Diagnoses   Final diagnoses:  BV (bacterial vaginosis)  Trichomoniasis  Asymptomatic microscopic hematuria    New Prescriptions New Prescriptions   METRONIDAZOLE (FLAGYL) 500 MG TABLET    Take 1 tablet (500 mg total) by mouth 2 (two) times daily.   I personally performed the services described in this documentation, which was scribed in my presence. The recorded information has been reviewed and is accurate.      Roxy Horsemanobert Galit Urich, PA-C 03/02/17 0015    Layla MawKristen N Ward, DO 03/02/17 [id number]

## 2017-03-01 NOTE — ED Triage Notes (Signed)
Pt c/o pink "blood" either from her urine or from her vagina, pt is not sure, c/o bladder pressure intermittently that started today.  She is approximately [redacted] weeks pregnant, G6P5, no prenatal care yet. Pt denies any dysuria.

## 2017-03-01 NOTE — ED Notes (Signed)
c/o blood in urine x 1 days w increased freq,  Denies burning w urination  Has had vag dc x 2 days

## 2017-03-02 LAB — ABO/RH: ABO/RH(D): B POS

## 2017-03-02 MED ORDER — METRONIDAZOLE 500 MG PO TABS: 500.0000 mg | ORAL_TABLET | Freq: Two times a day (BID) | ORAL | 0 refills | Status: DC

## 2017-03-02 MED ORDER — METRONIDAZOLE 500 MG PO TABS
500.0000 mg | ORAL_TABLET | Freq: Once | ORAL | Status: AC
  Administered 2017-03-02: 500 mg via ORAL
  Filled 2017-03-02: qty 1

## 2017-03-03 LAB — GC/CHLAMYDIA PROBE AMP (~~LOC~~) NOT AT ARMC
CHLAMYDIA, DNA PROBE: NEGATIVE
NEISSERIA GONORRHEA: NEGATIVE

## 2017-03-04 LAB — URINE CULTURE: Culture: >=100000 — AB

## 2017-09-12 ENCOUNTER — Emergency Department (HOSPITAL_BASED_OUTPATIENT_CLINIC_OR_DEPARTMENT_OTHER)
Admission: EM | Admit: 2017-09-12 | Discharge: 2017-09-12 | Disposition: A | Discharge status: Home / Self Care | Payer: Medicaid Other | Attending: Emergency Medicine | Admitting: Emergency Medicine

## 2017-09-12 ENCOUNTER — Encounter (HOSPITAL_BASED_OUTPATIENT_CLINIC_OR_DEPARTMENT_OTHER): Payer: Self-pay | Admitting: *Deleted

## 2017-09-12 DIAGNOSIS — N3091 Cystitis, unspecified with hematuria: Secondary | ICD-10-CM | POA: Diagnosis not present

## 2017-09-12 DIAGNOSIS — R319 Hematuria, unspecified: Secondary | ICD-10-CM | POA: Diagnosis present

## 2017-09-12 DIAGNOSIS — R309 Painful micturition, unspecified: Secondary | ICD-10-CM | POA: Insufficient documentation

## 2017-09-12 LAB — URINALYSIS, MICROSCOPIC (REFLEX)

## 2017-09-12 LAB — URINALYSIS, ROUTINE W REFLEX MICROSCOPIC
Bilirubin Urine: NEGATIVE
GLUCOSE, UA: NEGATIVE mg/dL
KETONES UR: NEGATIVE mg/dL
Nitrite: NEGATIVE
PH: 8.5 — AB (ref 5.0–8.0)
PROTEIN: NEGATIVE mg/dL
Specific Gravity, Urine: 1.01 (ref 1.005–1.030)

## 2017-09-12 LAB — PREGNANCY, URINE: Preg Test, Ur: NEGATIVE

## 2017-09-12 MED ORDER — CEPHALEXIN 500 MG PO CAPS: 500.0000 mg | ORAL_CAPSULE | Freq: Two times a day (BID) | ORAL | 0 refills | Status: DC

## 2017-09-12 MED FILL — CEPHALEXIN 500 MG CAPSULE: 500 | 7 days supply | Qty: 14 | Fill #0

## 2017-09-12 NOTE — ED Provider Notes (Signed)
[facility]   CSN: [id number] Arrival date & time: 09/12/17  1228     History   Chief Complaint Chief Complaint  Patient presents with  . Hematuria    HPI Linda Webb is a 26 y.o. female.  Patient is a 26 year old female who presents with painful urination. She also has noticed some blood in her urine. The symptoms have been going on for about 2 days and have worsened recently. She denies any abdominal pain. No back pain. No nausea or vomiting. No fevers. She had a baby in August. She is not breast-feeding.      History reviewed. No pertinent past medical history.  There are no active problems to display for this patient.   History reviewed. No pertinent surgical history.  OB History    Gravida Para Term Preterm AB Living   4 2 0 0 0 2   SAB TAB Ectopic Multiple Live Births   0 0 0 0         Home Medications    Prior to Admission medications   Medication Sig Start Date End Date Taking? Authorizing Provider  cephALEXin (KEFLEX) 500 MG capsule Take 1 capsule (500 mg total) by mouth 2 (two) times daily. 09/12/17   Rolan Bucco, MD  metroNIDAZOLE (FLAGYL) 500 MG tablet Take 1 tablet (500 mg total) by mouth 2 (two) times daily. 03/02/17   Roxy Horseman, PA-C  miconazole (MICATIN) 2 % cream Apply 1 application topically 2 (two) times daily. Patient not taking: Reported on 03/01/2017 11/14/14   Emilia Beck, PA-C  Prenatal Vit w/Fe-Methylfol-FA (PNV PO) Take by mouth.    [provider]  Prenatal Vit-Fe Fumarate-FA (PRENATAL COMPLETE) 14-0.4 MG TABS Take 1 tablet by mouth daily. Patient not taking: Reported on 03/01/2017 11/14/14   Emilia Beck, PA-C    Family History No family history on file.  Social History Social History  Substance Use Topics  . Smoking status: Never Smoker  . Smokeless tobacco: Never Used  . Alcohol use No     Allergies   Patient has no known allergies.   Review of Systems Review  of Systems  Constitutional: Negative for chills, diaphoresis, fatigue and fever.  HENT: Negative for congestion, rhinorrhea and sneezing.   Eyes: Negative.   Respiratory: Negative for cough, chest tightness and shortness of breath.   Cardiovascular: Negative for chest pain and leg swelling.  Gastrointestinal: Negative for abdominal pain, blood in stool, diarrhea, nausea and vomiting.  Genitourinary: Positive for dysuria and frequency. Negative for difficulty urinating, flank pain and hematuria.  Musculoskeletal: Negative for arthralgias and back pain.  Skin: Negative for rash.  Neurological: Negative for dizziness, speech difficulty, weakness, numbness and headaches.     Physical Exam Updated Vital Signs BP 122/67   Pulse 86   Temp 98.2 F (36.8 C) (Oral)   Resp 18   Ht  (1.651 m)   Wt 54.4 kg (120 lb)   SpO2 100%   Breastfeeding? Unknown   BMI 19.97 kg/m   Physical Exam  Constitutional: She is oriented to person, place, and time. She appears well-developed and well-nourished.  HENT:  Head: Normocephalic and atraumatic.  Eyes: Pupils are equal, round, and reactive to light.  Neck: Normal range of motion. Neck supple.  Cardiovascular: Normal rate, regular rhythm and normal heart sounds.   Pulmonary/Chest: Effort normal and breath sounds normal. No respiratory distress. She has no wheezes. She has no rales. She exhibits no tenderness.  Abdominal: Soft. Bowel  sounds are normal. There is no tenderness. There is no rebound and no guarding.  Musculoskeletal: Normal range of motion. She exhibits no edema.  Lymphadenopathy:    She has no cervical adenopathy.  Neurological: She is alert and oriented to person, place, and time.  Skin: Skin is warm and dry. No rash noted.  Psychiatric: She has a normal mood and affect.     ED Treatments / Results  Labs (all labs ordered are listed, but only abnormal results are displayed) Labs Reviewed  URINALYSIS, ROUTINE W REFLEX  MICROSCOPIC - Abnormal; Notable for the following:       Result Value   APPearance CLOUDY (*)    pH 8.5 (*)    Hgb urine dipstick TRACE (*)    Leukocytes, UA MODERATE (*)    All other components within normal limits  URINALYSIS, MICROSCOPIC (REFLEX) - Abnormal; Notable for the following:    Bacteria, UA FEW (*)    Squamous Epithelial / LPF 0-5 (*)    All other components within normal limits  PREGNANCY, URINE    EKG  EKG Interpretation None       Radiology No results found.  Procedures Procedures (including critical care time)  Medications Ordered in ED Medications - No data to display   Initial Impression / Assessment and Plan / ED Course  I have reviewed the triage vital signs and the nursing notes.  Pertinent labs & imaging results that were available during my care of the patient were reviewed by me and considered in my medical decision making (see chart for details).     Patient has evidence of a urinary tract infection. She does have some blood in her urine but she doesn't have clinical symptoms that would be more suggestive of renal colic. She was discharged home in good condition. She was given a prescription for Keflex. She was encouraged to follow-up with her primary care physician if her symptoms are not improving or return here as needed for any worsening symptoms.  Final Clinical Impressions(s) / ED Diagnoses   Final diagnoses:  Hemorrhagic cystitis    New Prescriptions New Prescriptions   CEPHALEXIN (KEFLEX) 500 MG CAPSULE    Take 1 capsule (500 mg total) by mouth 2 (two) times daily.     Rolan Bucco, MD 09/12/17 1345

## 2017-09-12 NOTE — ED Triage Notes (Signed)
Hematuria and dysuria.

## 2019-05-28 ENCOUNTER — Emergency Department (HOSPITAL_BASED_OUTPATIENT_CLINIC_OR_DEPARTMENT_OTHER): Payer: Self-pay

## 2019-05-28 ENCOUNTER — Encounter (HOSPITAL_BASED_OUTPATIENT_CLINIC_OR_DEPARTMENT_OTHER): Payer: Self-pay

## 2019-05-28 ENCOUNTER — Emergency Department (HOSPITAL_BASED_OUTPATIENT_CLINIC_OR_DEPARTMENT_OTHER)
Admission: EM | Admit: 2019-05-28 | Discharge: 2019-05-28 | Disposition: A | Discharge status: Home / Self Care | Payer: Self-pay | Attending: Emergency Medicine | Admitting: Emergency Medicine

## 2019-05-28 ENCOUNTER — Other Ambulatory Visit: Payer: Self-pay

## 2019-05-28 DIAGNOSIS — M79644 Pain in right finger(s): Secondary | ICD-10-CM | POA: Insufficient documentation

## 2019-05-28 DIAGNOSIS — F17210 Nicotine dependence, cigarettes, uncomplicated: Secondary | ICD-10-CM | POA: Insufficient documentation

## 2019-05-28 DIAGNOSIS — W503XXA Accidental bite by another person, initial encounter: Secondary | ICD-10-CM

## 2019-05-28 MED ORDER — AMOXICILLIN-POT CLAVULANATE 875-125 MG PO TABS: 1.0000 | ORAL_TABLET | Freq: Two times a day (BID) | ORAL | 0 refills | Status: DC

## 2019-05-28 MED ORDER — AMOXICILLIN-POT CLAVULANATE 875-125 MG PO TABS: 1.0000 | ORAL_TABLET | Freq: Two times a day (BID) | ORAL | 0 refills | Status: AC
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
## 2019-05-28 NOTE — Discharge Instructions (Addendum)
Take Augmentin until completed to prevent infection.  Wear thumb spica for comfort.  Use ice 3-4 times daily alternating 20 minutes on, 20 minutes off. You can take ibuprofen or Tylenol as prescribed at counter, as needed for your pain. Please follow-up with Dr. Barbaraann Barthel if symptoms are not improving.  Please return to the emergency department if you develop any new or worsening symptoms.

## 2019-07-13 ENCOUNTER — Emergency Department (HOSPITAL_BASED_OUTPATIENT_CLINIC_OR_DEPARTMENT_OTHER)
Admission: EM | Admit: 2019-07-13 | Discharge: 2019-07-13 | Disposition: A | Discharge status: Home / Self Care | Payer: Self-pay | Attending: Emergency Medicine | Admitting: Emergency Medicine

## 2019-07-13 ENCOUNTER — Encounter (HOSPITAL_BASED_OUTPATIENT_CLINIC_OR_DEPARTMENT_OTHER): Payer: Self-pay | Admitting: Emergency Medicine

## 2019-07-13 ENCOUNTER — Other Ambulatory Visit: Payer: Self-pay

## 2019-07-13 DIAGNOSIS — Y939 Activity, unspecified: Secondary | ICD-10-CM | POA: Insufficient documentation

## 2019-07-13 DIAGNOSIS — F17210 Nicotine dependence, cigarettes, uncomplicated: Secondary | ICD-10-CM | POA: Insufficient documentation

## 2019-07-13 DIAGNOSIS — X58XXXA Exposure to other specified factors, initial encounter: Secondary | ICD-10-CM | POA: Insufficient documentation

## 2019-07-13 DIAGNOSIS — Y999 Unspecified external cause status: Secondary | ICD-10-CM | POA: Insufficient documentation

## 2019-07-13 DIAGNOSIS — S025XXA Fracture of tooth (traumatic), initial encounter for closed fracture: Secondary | ICD-10-CM | POA: Insufficient documentation

## 2019-07-13 DIAGNOSIS — K0889 Other specified disorders of teeth and supporting structures: Secondary | ICD-10-CM

## 2019-07-13 DIAGNOSIS — Y929 Unspecified place or not applicable: Secondary | ICD-10-CM | POA: Insufficient documentation

## 2019-07-13 MED ORDER — AMOXICILLIN 500 MG PO CAPS
500.0000 mg | ORAL_CAPSULE | Freq: Once | ORAL | Status: AC
Start: 2019-07-13 — End: 2019-07-13
  Administered 2019-07-13: 500 mg via ORAL
  Filled 2019-07-13: qty 1

## 2019-07-13 MED ORDER — BUPIVACAINE-EPINEPHRINE (PF) 0.5% -1:200000 IJ SOLN
1.8000 mL | Freq: Once | INTRAMUSCULAR | Status: AC
  Administered 2019-07-13: 04:00:00 1.8 mL
  Filled 2019-07-13: qty 1.8

## 2019-07-13 MED ORDER — HYDROCODONE-ACETAMINOPHEN 5-325 MG PO TABS: 1.0000 | ORAL_TABLET | Freq: Four times a day (QID) | ORAL | 0 refills | Status: DC | PRN

## 2019-07-13 MED ORDER — FLUCONAZOLE 150 MG PO TABS: ORAL_TABLET | ORAL | 0 refills | Status: DC

## 2019-07-13 MED ORDER — AMOXICILLIN 500 MG PO CAPS: 500.0000 mg | ORAL_CAPSULE | Freq: Three times a day (TID) | ORAL | 0 refills | Status: DC

## 2019-07-13 NOTE — ED Notes (Signed)
Pt understood dc material. NAD noted. Scripts given at [employer] and electronically. All questions answered to satisfaction. Pt escorted to check out counter.

## 2019-07-13 NOTE — ED Provider Notes (Signed)
[facility] Provider Note: Georgena Spurling, MD, FACEP  CSN: [id number] MRN: [id number] ARRIVAL: 07/13/19 at 0400 ROOM: [facility]   HISTORY OF PRESENT ILLNESS  07/13/19 4:20 AM Linda Webb is a 28 y.o. female who complains of pain in her right upper third molar for 2 days.  She believes it recently fractured.  She rates her pain as a 10 out of 10.  It has not improved with ibuprofen 400 mg which she took 3 hours ago.  She denies associated fever, chills, nausea or vomiting.    History reviewed. No pertinent past medical history.  History reviewed. No pertinent surgical history.  No family history on file.  Social History   Tobacco Use  . Smoking status: Current Every Day Smoker    Types: Cigarettes  . Smokeless tobacco: Never Used  Substance Use Topics  . Alcohol use: No  . Drug use: No    Prior to Admission medications   Medication Sig Start Date End Date Taking? Authorizing Provider  cephALEXin (KEFLEX) 500 MG capsule Take 1 capsule (500 mg total) by mouth 2 (two) times daily. 09/12/17   Malvin Johns, MD  metroNIDAZOLE (FLAGYL) 500 MG tablet Take 1 tablet (500 mg total) by mouth 2 (two) times daily. 03/02/17   Montine Circle, PA-C  miconazole (MICATIN) 2 % cream Apply 1 application topically 2 (two) times daily. Patient not taking: Reported on 03/01/2017 11/14/14   Alvina Chou, PA-C  Prenatal Vit w/Fe-Methylfol-FA (PNV PO) Take by mouth.    [provider]  Prenatal Vit-Fe Fumarate-FA (PRENATAL COMPLETE) 14-0.4 MG TABS Take 1 tablet by mouth daily. Patient not taking: Reported on 03/01/2017 11/14/14   Alvina Chou, PA-C    Allergies Patient has no known allergies.   REVIEW OF SYSTEMS  Negative except as noted here or in the History of Present Illness.   PHYSICAL EXAMINATION  Initial Vital Signs Blood pressure (!) 126/92, pulse 62, temperature 97.8 F (36.6 C), temperature source Oral, resp.  rate 18, height 5\' 4"  (1.626 m), weight 49.9 kg, last menstrual period 07/10/2019, SpO2 100 %, unknown if currently breastfeeding.  Examination General: Well-developed, well-nourished female in no acute distress; appearance consistent with age of record HENT: normocephalic; atraumatic; fractured right upper third molar with tenderness to percussion Eyes: Normal appearance Neck: supple; right anterior cervical lymphadenopathy Heart: regular rate and rhythm Lungs: clear to auscultation bilaterally Abdomen: soft; nondistended; nontender; bowel sounds present Extremities: No deformity; full range of motion; pulses normal Neurologic: Awake, alert and oriented; motor function intact in all extremities and symmetric; no facial droop Skin: Warm and dry Psychiatric: Grimacing   RESULTS  Summary of this visit's results, reviewed by myself:   EKG Interpretation  Date/Time:    Ventricular Rate:    PR Interval:    QRS Duration:   QT Interval:    QTC Calculation:   R Axis:     Text Interpretation:        Laboratory Studies: No results found for this or any previous visit (from the past 24 hour(s)). Imaging Studies: No results found.  ED COURSE and MDM  Nursing notes and initial vitals signs, including pulse oximetry, reviewed.  Vitals:   07/13/19 0406 07/13/19 0408  BP:  (!) 126/92  Pulse:  62  Resp:  18  Temp:  97.8 F (36.6 C)  TempSrc:  Oral  SpO2:  100%  Weight: 49.9 kg   Height: 5\' 4"  (1.626 m)  Dental block performed.  Will start patient on amoxicillin and refer to Dr. Barbette MerinoJensen of oral surgery for definitive follow-up.  PROCEDURES   DENTAL BLOCK 1.8 milliliters of 0.5% bupivacaine with epinephrine were injected into the buccal fold adjacent to the right upper third molar. The patient tolerated this well and there were no immediate complications. Adequate analgesia was obtained.   ED DIAGNOSES     ICD-10-CM   1. Closed fracture of tooth, initial encounter   S02.5XXA   2. Pain, dental  K08.89        Paula LibraMolpus, Jevaughn Degollado, MD 07/13/19 [phone]

## 2019-07-13 NOTE — ED Triage Notes (Signed)
Pt states she has had a toothache for two days. Right upper back two teeth. Denies fevers, n/v. Has tried ibuprofen 400mg  approx 3 hours ago.

## 2019-09-04 ENCOUNTER — Emergency Department (HOSPITAL_BASED_OUTPATIENT_CLINIC_OR_DEPARTMENT_OTHER)
Admission: EM | Admit: 2019-09-04 | Discharge: 2019-09-04 | Disposition: A | Discharge status: Home / Self Care | Payer: Self-pay | Attending: Emergency Medicine | Admitting: Emergency Medicine

## 2019-09-04 ENCOUNTER — Encounter (HOSPITAL_BASED_OUTPATIENT_CLINIC_OR_DEPARTMENT_OTHER): Payer: Self-pay | Admitting: *Deleted

## 2019-09-04 ENCOUNTER — Other Ambulatory Visit: Payer: Self-pay

## 2019-09-04 DIAGNOSIS — N760 Acute vaginitis: Secondary | ICD-10-CM | POA: Insufficient documentation

## 2019-09-04 DIAGNOSIS — B9689 Other specified bacterial agents as the cause of diseases classified elsewhere: Secondary | ICD-10-CM | POA: Insufficient documentation

## 2019-09-04 DIAGNOSIS — N390 Urinary tract infection, site not specified: Secondary | ICD-10-CM | POA: Insufficient documentation

## 2019-09-04 DIAGNOSIS — Z87891 Personal history of nicotine dependence: Secondary | ICD-10-CM | POA: Insufficient documentation

## 2019-09-04 DIAGNOSIS — R82998 Other abnormal findings in urine: Secondary | ICD-10-CM | POA: Insufficient documentation

## 2019-09-04 LAB — URINALYSIS, ROUTINE W REFLEX MICROSCOPIC
Bilirubin Urine: NEGATIVE
Glucose, UA: NEGATIVE mg/dL
Ketones, ur: NEGATIVE mg/dL
Nitrite: POSITIVE — AB
Protein, ur: NEGATIVE mg/dL
Specific Gravity, Urine: 1.025 (ref 1.005–1.030)
pH: 6 (ref 5.0–8.0)

## 2019-09-04 LAB — URINALYSIS, MICROSCOPIC (REFLEX)

## 2019-09-04 LAB — PREGNANCY, URINE: Preg Test, Ur: NEGATIVE

## 2019-09-04 LAB — WET PREP, GENITAL
Sperm: NONE SEEN
Trich, Wet Prep: NONE SEEN
Yeast Wet Prep HPF POC: NONE SEEN

## 2019-09-04 MED ORDER — CEPHALEXIN 250 MG PO CAPS
500.0000 mg | ORAL_CAPSULE | Freq: Once | ORAL | Status: AC
  Administered 2019-09-04: 500 mg via ORAL
  Filled 2019-09-04: qty 2

## 2019-09-04 MED ORDER — METRONIDAZOLE 500 MG PO TABS: 500.0000 mg | ORAL_TABLET | Freq: Two times a day (BID) | ORAL | 0 refills | Status: AC

## 2019-09-04 MED ORDER — METRONIDAZOLE 500 MG PO TABS
500.0000 mg | ORAL_TABLET | Freq: Once | ORAL | Status: AC
  Administered 2019-09-04: 01:00:00 500 mg via ORAL
  Filled 2019-09-04: qty 1

## 2019-09-04 MED ORDER — CEPHALEXIN 500 MG PO CAPS: 500.0000 mg | ORAL_CAPSULE | Freq: Two times a day (BID) | ORAL | 0 refills | Status: DC

## 2019-09-04 NOTE — ED Notes (Signed)
Pt states her urine looked cloudy and she wanted to be checked for a STD, pregnancy, and UTI  Pt denies pain or abnormal vaginal discharge

## 2019-09-04 NOTE — ED Notes (Signed)
ED Provider at bedside. 

## 2019-09-04 NOTE — ED Provider Notes (Signed)
CHIEF COMPLAINT: Cloudy urine, requesting STD screening  HPI: Patient is a 28 year old female who presents to the emergency department requesting STD screening.  States she has had 1 day of cloudy urine.  No dysuria, hematuria, vaginal bleeding or discharge.  Last menstrual period was 1 month ago.  No abdominal pain, fevers, vomiting or diarrhea.  Sexually active with her husband only.  She is a G7, P7.  ROS: See HPI Constitutional: no fever  Eyes: no drainage  ENT: no runny nose   Cardiovascular:  no chest pain  Resp: no SOB  GI: no vomiting GU: no dysuria Integumentary: no rash  Allergy: no hives  Musculoskeletal: no leg swelling  Neurological: no slurred speech ROS otherwise negative  PAST MEDICAL HISTORY/PAST SURGICAL HISTORY:  History reviewed. No pertinent past medical history.  MEDICATIONS:  Prior to Admission medications   Not on File    ALLERGIES:  No Known Allergies  SOCIAL HISTORY:  Social History   Tobacco Use  . Smoking status: Former Smoker    Types: Cigarettes  . Smokeless tobacco: Never Used  Substance Use Topics  . Alcohol use: No    FAMILY HISTORY: No family history on file.  EXAM: BP (!) 136/97   Pulse 70   Temp 98.7 F (37.1 C)   Resp 18   Ht 5\' 5"  (1.651 m)   Wt 54.4 kg   LMP 07/04/2019   SpO2 100%   BMI 19.97 kg/m  CONSTITUTIONAL: Alert and oriented and responds appropriately to questions. Well-appearing; well-nourished HEAD: Normocephalic EYES: Conjunctivae clear, pupils appear equal, EOMI ENT: normal nose; moist mucous membranes NECK: Supple, no meningismus, no nuchal rigidity, no LAD  CARD: RRR; S1 and S2 appreciated; no murmurs, no clicks, no rubs, no gallops RESP: Normal chest excursion without splinting or tachypnea; breath sounds clear and equal bilaterally; no wheezes, no rhonchi, no rales, no hypoxia or respiratory distress, speaking full sentences ABD/GI: Normal bowel sounds; non-distended; soft, non-tender, no rebound,  no guarding, no peritoneal signs, no hepatosplenomegaly GU:  Normal external genitalia. No lesions, rashes noted. Patient has no vaginal bleeding on exam.  Mild amount of thin white vaginal discharge.  No adnexal tenderness, mass or fullness, no cervical motion tenderness. Cervix is not appear friable.  Cervix is closed.  Chaperone present for exam. BACK:  The back appears normal and is non-tender to palpation, there is no CVA tenderness EXT: Normal ROM in all joints; non-tender to palpation; no edema; normal capillary refill; no cyanosis, no calf tenderness or swelling    SKIN: Normal color for age and race; warm; no rash NEURO: Moves all extremities equally PSYCH: The patient's mood and manner are appropriate. Grooming and personal hygiene are appropriate.  MEDICAL DECISION MAKING: Patient here with complaints of cloudy urine.  Urine appears infected today with positive nitrites, small leukocyte esterase, 21-50 white blood cells and many bacteria.  Previous urine culture has grown E. coli that was pansensitive.  Will start on Keflex.  No flank pain, vomiting, fever.  Doubt pyelonephritis.  Abdominal exam benign.  Wet prep also positive for bacterial vaginosis.  She does have discharge on exam.  We will also treat with Flagyl.  Pregnancy test negative.  Doubt TOA, torsion, PID, appendicitis.  I feel patient is safe to be discharged home.  At this time, I do not feel there is any life-threatening condition present. I have reviewed and discussed all results (EKG, imaging, lab, urine as appropriate) and exam findings with patient/family. I have reviewed nursing notes  and appropriate previous records.  I feel the patient is safe to be discharged home without further emergent workup and can continue workup as an outpatient as needed. Discussed usual and customary return precautions. Patient/family verbalize understanding and are comfortable with this plan.  Outpatient follow-up has been provided as needed. All  questions have been answered.   Cannon Quinton was evaluated in Emergency Department on 09/04/2019 for the symptoms described in the history of present illness. She was evaluated in the context of the global COVID-19 pandemic, which necessitated consideration that the patient might be at risk for infection with the SARS-CoV-2 virus that causes COVID-19. Institutional protocols and algorithms that pertain to the evaluation of patients at risk for COVID-19 are in a state of rapid change based on information released by regulatory bodies including the CDC and federal and state organizations. These policies and algorithms were followed during the patient's care in the ED.     , Layla Maw, DO 09/04/19 [phone]

## 2019-09-04 NOTE — ED Triage Notes (Signed)
Pt requesting STD check , also c/o urine is " cloudy" x 1 day. Denies vaginal discharge

## 2019-09-05 LAB — GC/CHLAMYDIA PROBE AMP (~~LOC~~) NOT AT ARMC
Chlamydia: POSITIVE — AB
Molecular Disclaimer: NEGATIVE
Molecular Disclaimer: NORMAL
Neisseria Gonorrhea: NEGATIVE

## 2019-09-06 LAB — URINE CULTURE: Culture: >=100000 — AB

## 2019-09-07 ENCOUNTER — Telehealth: Payer: Self-pay | Admitting: Emergency Medicine

## 2019-09-07 NOTE — Telephone Encounter (Signed)
Post ED Visit - Positive Culture Follow-up  Culture report reviewed by antimicrobial stewardship pharmacist: [facility] Team []  Elenor Quinones, Pharm.D. [x]  Heide Guile, Pharm.D., BCPS AQ-ID []  Parks Neptune, Pharm.D., BCPS []  Alycia Rossetti, Pharm.D., BCPS []  [place], Pharm.D., BCPS, AAHIVP []  Legrand Como, Pharm.D., BCPS, AAHIVP []  Salome Arnt, PharmD, BCPS []  Johnnette Gourd, PharmD, BCPS []  Hughes Better, PharmD, BCPS []  Leeroy Cha, PharmD []  Laqueta Linden, PharmD, BCPS []  Albertina Parr, PharmD  [place] Team []  Leodis Sias, PharmD []  Lindell Spar, PharmD []  Royetta Asal, PharmD []  Graylin Shiver, Rph []  Rema Fendt) Glennon Mac, PharmD []  Arlyn Dunning, PharmD []  Netta Cedars, PharmD []  Dia Sitter, PharmD []  Leone Haven, PharmD []  Gretta Arab, PharmD []  Theodis Shove, PharmD []  Peggyann Juba, PharmD []  Reuel Boom, PharmD   Positive urine culture Treated with Cephalexin, organism sensitive to the same and no further patient follow-up is required at this time.  Sandi Raveling Breven Guidroz 09/07/2019, 9:55 AM

## 2019-09-27 ENCOUNTER — Emergency Department (HOSPITAL_BASED_OUTPATIENT_CLINIC_OR_DEPARTMENT_OTHER)
Admission: EM | Admit: 2019-09-27 | Discharge: 2019-09-27 | Disposition: A | Discharge status: Home / Self Care | Payer: Medicaid Other | Attending: Emergency Medicine | Admitting: Emergency Medicine

## 2019-09-27 ENCOUNTER — Other Ambulatory Visit: Payer: Self-pay

## 2019-09-27 ENCOUNTER — Encounter (HOSPITAL_BASED_OUTPATIENT_CLINIC_OR_DEPARTMENT_OTHER): Payer: Self-pay | Admitting: Emergency Medicine

## 2019-09-27 DIAGNOSIS — Z79899 Other long term (current) drug therapy: Secondary | ICD-10-CM | POA: Insufficient documentation

## 2019-09-27 DIAGNOSIS — Z3A01 Less than 8 weeks gestation of pregnancy: Secondary | ICD-10-CM | POA: Diagnosis not present

## 2019-09-27 DIAGNOSIS — O219 Vomiting of pregnancy, unspecified: Secondary | ICD-10-CM | POA: Insufficient documentation

## 2019-09-27 DIAGNOSIS — Z3A08 8 weeks gestation of pregnancy: Secondary | ICD-10-CM

## 2019-09-27 LAB — URINALYSIS, ROUTINE W REFLEX MICROSCOPIC
Bilirubin Urine: NEGATIVE
Glucose, UA: NEGATIVE mg/dL
Hgb urine dipstick: NEGATIVE
Ketones, ur: NEGATIVE mg/dL
Leukocytes,Ua: NEGATIVE
Nitrite: NEGATIVE
Protein, ur: NEGATIVE mg/dL
Specific Gravity, Urine: 1.02 (ref 1.005–1.030)
pH: 8 (ref 5.0–8.0)

## 2019-09-27 LAB — PREGNANCY, URINE: Preg Test, Ur: POSITIVE — AB

## 2019-09-27 NOTE — ED Provider Notes (Signed)
[facility] EMERGENCY DEPARTMENT Provider Note   CSN: [id number] Arrival date & time: 09/27/19  1119     History   Chief Complaint Chief Complaint  Patient presents with  . Wants Pregnancy Test    HPI Linda Webb is a 28 y.o. female who presents the emergency department with concern for pregnancy.  Patient states that she has been pregnant 8 times.  She has 7 children.  She states that for the past month she has had morning time nausea, early morning vomiting, hypersensitivity to smells, breast enlargement which is typical for every other pregnancy she has had.  She denies urinary symptoms, fever, chills.  Patient states that there was a miscommunication with the triage nurse and she does not have loss of sense of smell or taste.  He has had no cough or any known exposure to Covid.  Her last menstrual period was August 02, 2019  edd 05/08/2020    l     History reviewed. No pertinent past medical history.  There are no active problems to display for this patient.   History reviewed. No pertinent surgical history.   OB History    Gravida  4   Para  2   Term  0   Preterm  0   AB  0   Living  2     SAB  0   TAB  0   Ectopic  0   Multiple  0   Live Births               Home Medications    Prior to Admission medications   Medication Sig Start Date End Date Taking? Authorizing Provider  cephALEXin (KEFLEX) 500 MG capsule Take 1 capsule (500 mg total) by mouth 2 (two) times daily. 09/04/19   Ward, Delice Bison, DO  metroNIDAZOLE (FLAGYL) 500 MG tablet Take 1 tablet (500 mg total) by mouth 2 (two) times daily. 09/04/19   Ward, Delice Bison, DO    Family History No family history on file.  Social History Social History   Tobacco Use  . Smoking status: Former Smoker    Types: Cigarettes  . Smokeless tobacco: Never Used  Substance Use Topics  . Alcohol use: No  . Drug use: No     Allergies   Patient has no known allergies.   Review of  Systems Review of Systems  Ten systems reviewed and are negative for acute change, except as noted in the HPI.   Physical Exam Updated Vital Signs BP 111/68 (BP Location: Right Arm)   Pulse 73   Temp 99 F (37.2 C) (Oral)   Resp 14   Ht 5\' 5"  (1.651 m)   Wt 54.4 kg   LMP 08/02/2019   SpO2 100%   BMI 19.97 kg/m   Physical Exam  Physical Exam  Nursing note and vitals reviewed. Constitutional: She is oriented to person, place, and time. She appears well-developed and well-nourished. No distress.  HENT:  Head: Normocephalic and atraumatic.  Eyes: Conjunctivae normal and EOM are normal. Pupils are equal, round, and reactive to light. No scleral icterus.  Neck: Normal range of motion.  Cardiovascular: Normal rate, regular rhythm and normal heart sounds.  Exam reveals no gallop and no friction rub.   No murmur heard. Pulmonary/Chest: Effort normal and breath sounds normal. No respiratory distress.  Abdominal: Soft. Bowel sounds are normal. She exhibits no distension and no mass. There is no tenderness. There is no guarding.  Neurological: She  is alert and oriented to person, place, and time.  Skin: Skin is warm and dry. She is not diaphoretic.    ED Treatments / Results  Labs (all labs ordered are listed, but only abnormal results are displayed) Labs Reviewed  URINALYSIS, ROUTINE W REFLEX MICROSCOPIC  PREGNANCY, URINE    EKG None  Radiology No results found.  Procedures Procedures (including critical care time)  Medications Ordered in ED Medications - No data to display   Initial Impression / Assessment and Plan / ED Course  I have reviewed the triage vital signs and the nursing notes.  Pertinent labs & imaging results that were available during my care of the patient were reviewed by me and considered in my medical decision making (see chart for details).       Patient here for pregnancy test which is positive.  Her estimated date of delivery is 05/08/2020.   She is an estimated 8 weeks and 2 days from last menstrual period.  Patient given pregnancy verification letter.  She has no other complaints at this time.  She was appropriate for discharge at this time Final Clinical Impressions(s) / ED Diagnoses   Final diagnoses:  [redacted] weeks gestation of pregnancy    ED Discharge Orders    None       Arthor Captain, PA-C 09/27/19 1759    Little, Ambrose Finland, MD 09/28/19 [phone]

## 2019-09-27 NOTE — Discharge Instructions (Addendum)
You are approximately [redacted] weeks pregnant with an estimated delivery date of 05/08/2020. Please begin taking prenatal vitamins. Please follow closely with obstetrics and gynecology.  Return if you have intractable vomiting.  Return if you have any new or worsening symptoms or concerns.

## 2019-09-27 NOTE — ED Triage Notes (Signed)
Pt sts she thinks she is pregnant; period is 2 mos late; sts "smells are strong to me" and c/o morning sickness

## 2019-10-23 ENCOUNTER — Encounter (HOSPITAL_BASED_OUTPATIENT_CLINIC_OR_DEPARTMENT_OTHER): Payer: Self-pay | Admitting: Emergency Medicine

## 2019-10-23 ENCOUNTER — Other Ambulatory Visit: Payer: Self-pay

## 2019-10-23 ENCOUNTER — Emergency Department (HOSPITAL_BASED_OUTPATIENT_CLINIC_OR_DEPARTMENT_OTHER)
Admission: EM | Admit: 2019-10-23 | Discharge: 2019-10-23 | Disposition: A | Discharge status: Home / Self Care | Payer: Medicaid Other | Attending: Emergency Medicine | Admitting: Emergency Medicine

## 2019-10-23 DIAGNOSIS — K0889 Other specified disorders of teeth and supporting structures: Secondary | ICD-10-CM | POA: Diagnosis present

## 2019-10-23 DIAGNOSIS — Z87891 Personal history of nicotine dependence: Secondary | ICD-10-CM | POA: Insufficient documentation

## 2019-10-23 NOTE — ED Provider Notes (Signed)
[facility] EMERGENCY DEPARTMENT Provider Note   CSN: [id number] Arrival date & time: 10/23/19  0813     History   Chief Complaint Chief Complaint  Patient presents with  . Dental Pain    HPI Linda Webb is a 28 y.o. female.     The history is provided by the patient.  Dental Pain Location:  Upper Quality:  Aching Severity:  Mild Onset quality:  Gradual Duration:  3 days Timing:  Intermittent Chronicity:  New Context comment:  Wisdom teeth Relieved by:  Acetaminophen Worsened by:  Nothing Associated symptoms: no congestion, no difficulty swallowing, no drooling, no facial pain, no facial swelling, no fever, no gum swelling, no headaches, no neck pain, no neck swelling, no oral bleeding, no oral lesions and no trismus   Risk factors: no diabetes and no immunosuppression     History reviewed. No pertinent past medical history.  There are no active problems to display for this patient.   History reviewed. No pertinent surgical history.   OB History    Gravida  5   Para  2   Term  0   Preterm  0   AB  0   Living  2     SAB  0   TAB  0   Ectopic  0   Multiple  0   Live Births               Home Medications    Prior to Admission medications   Medication Sig Start Date End Date Taking? Authorizing Provider  cephALEXin (KEFLEX) 500 MG capsule Take 1 capsule (500 mg total) by mouth 2 (two) times daily. 09/04/19   Ward, Layla Maw, DO  metroNIDAZOLE (FLAGYL) 500 MG tablet Take 1 tablet (500 mg total) by mouth 2 (two) times daily. 09/04/19   Ward, Layla Maw, DO    Family History No family history on file.  Social History Social History   Tobacco Use  . Smoking status: Former Smoker    Types: Cigarettes  . Smokeless tobacco: Never Used  Substance Use Topics  . Alcohol use: No  . Drug use: No     Allergies   Patient has no known allergies.   Review of Systems Review of Systems  Constitutional: Negative for fever.   HENT: Positive for dental problem. Negative for congestion, drooling, ear discharge, ear pain, facial swelling, mouth sores, sinus pressure, sneezing, tinnitus, trouble swallowing and voice change.   Musculoskeletal: Negative for neck pain.  Neurological: Negative for headaches.     Physical Exam Updated Vital Signs BP 127/76 (BP Location: Right Arm)   Pulse 80   Temp 98.5 F (36.9 C) (Oral)   Resp 16   Ht 5\' 7"  (1.702 m)   Wt 54.4 kg   LMP 07/31/2019   SpO2 100%   BMI 18.79 kg/m   Physical Exam Constitutional:      General: She is not in acute distress.    Appearance: She is not ill-appearing.  HENT:     Head: Normocephalic and atraumatic.     Nose: Nose normal.     Mouth/Throat:     Mouth: Mucous membranes are moist.     Pharynx: No oropharyngeal exudate or posterior oropharyngeal erythema.     Comments: Overall normal dentition, no signs of dental caries, no signs of periapical abscess Eyes:     Pupils: Pupils are equal, round, and reactive to light.  Neurological:     Mental Status: She is  alert.      ED Treatments / Results  Labs (all labs ordered are listed, but only abnormal results are displayed) Labs Reviewed - No data to display  EKG None  Radiology No results found.  Procedures Procedures (including critical care time)  Medications Ordered in ED Medications - No data to display   Initial Impression / Assessment and Plan / ED Course  I have reviewed the triage vital signs and the nursing notes.  Pertinent labs & imaging results that were available during my care of the patient were reviewed by me and considered in my medical decision making (see chart for details).       Linda Webb is a 28 year old female who presents to the ED with dental pain.  Patient with normal vitals.  No fever.  Patient about [redacted] weeks pregnant.  Does not have any bleeding or other issues related to pregnancy.  Mostly pain in her upper wisdom teeth.  Ongoing for  the last several days.  No signs of infection.  Recommend continued use of Tylenol at home for pain.  Given that she is pregnant will avoid unneeded antibiotics at this time and narcotic pain medicine.  Given resources for dental clinic.  Given return precautions.  Did not have any fever.  Did not have any signs to suggest severe dental infection on exam.  This chart was dictated using voice recognition software.  Despite best efforts to proofread,  errors can occur which can change the documentation meaning.    Final Clinical Impressions(s) / ED Diagnoses   Final diagnoses:  Pain, dental    ED Discharge Orders    None       Lennice Sites, DO 10/23/19 [phone]

## 2019-10-23 NOTE — ED Notes (Signed)
Pt given hot pack. Pt verbalized understanding of d/c instructions.

## 2019-10-23 NOTE — ED Triage Notes (Signed)
R upper dental pain x 3 days. She is approximately [redacted] weeks pregnant.

## 2020-05-04 IMAGING — CR RIGHT THUMB 2+V
3 series · 3 of 3 positions shown · non-contrast
Comparison: None.

CLINICAL DATA: Right thumb pain after injury today.

EXAM:
RIGHT THUMB 2+V

[x finger pa right]
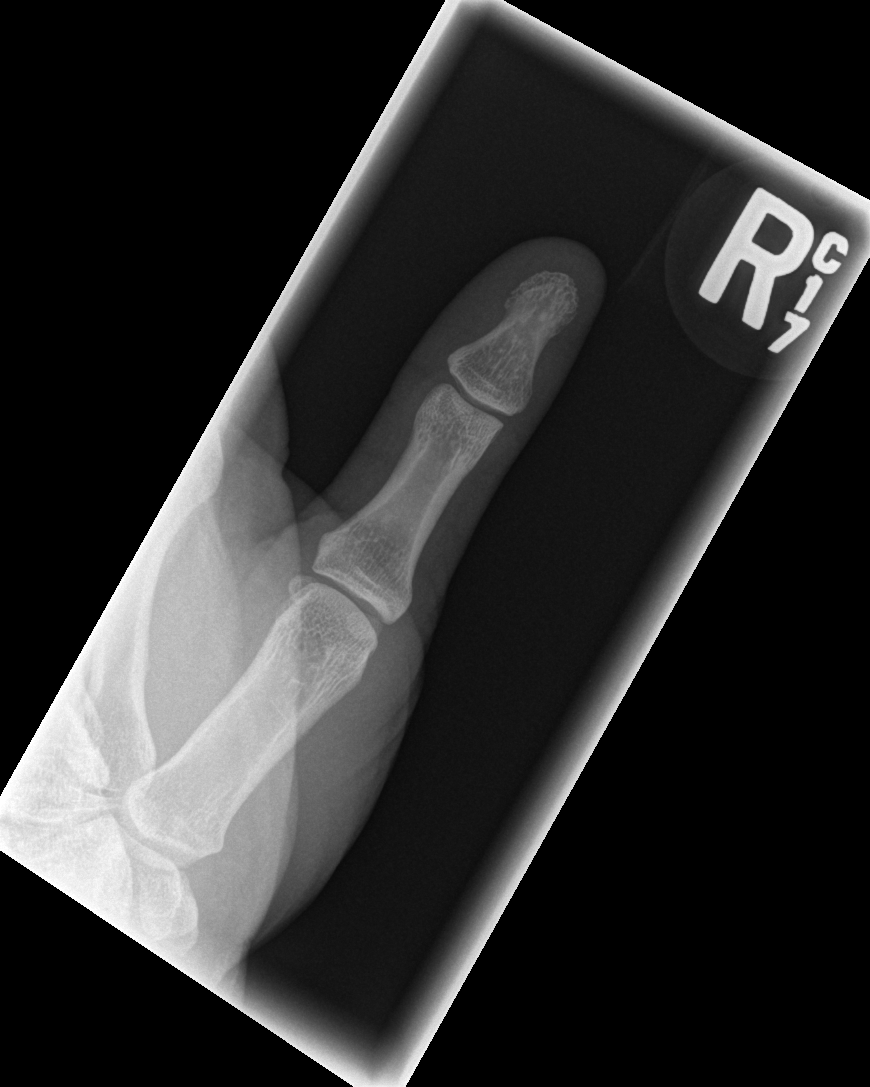

[x finger obl. right]
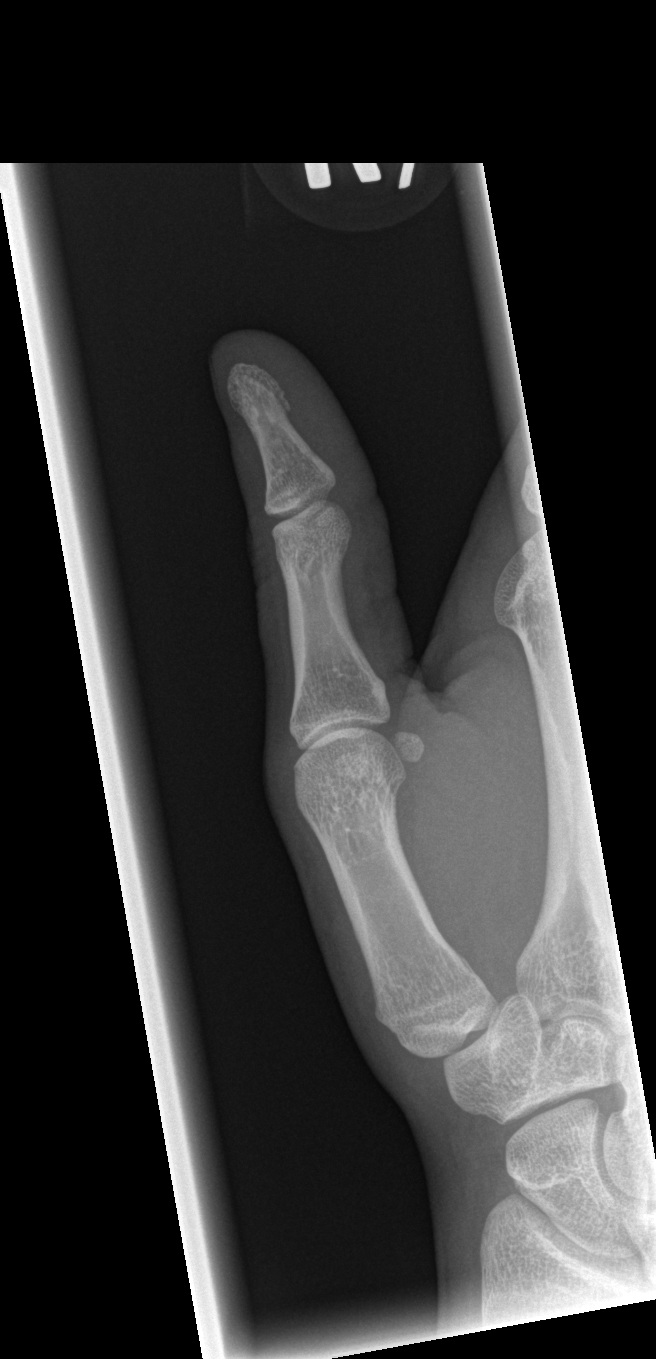

[x finger lateral right]
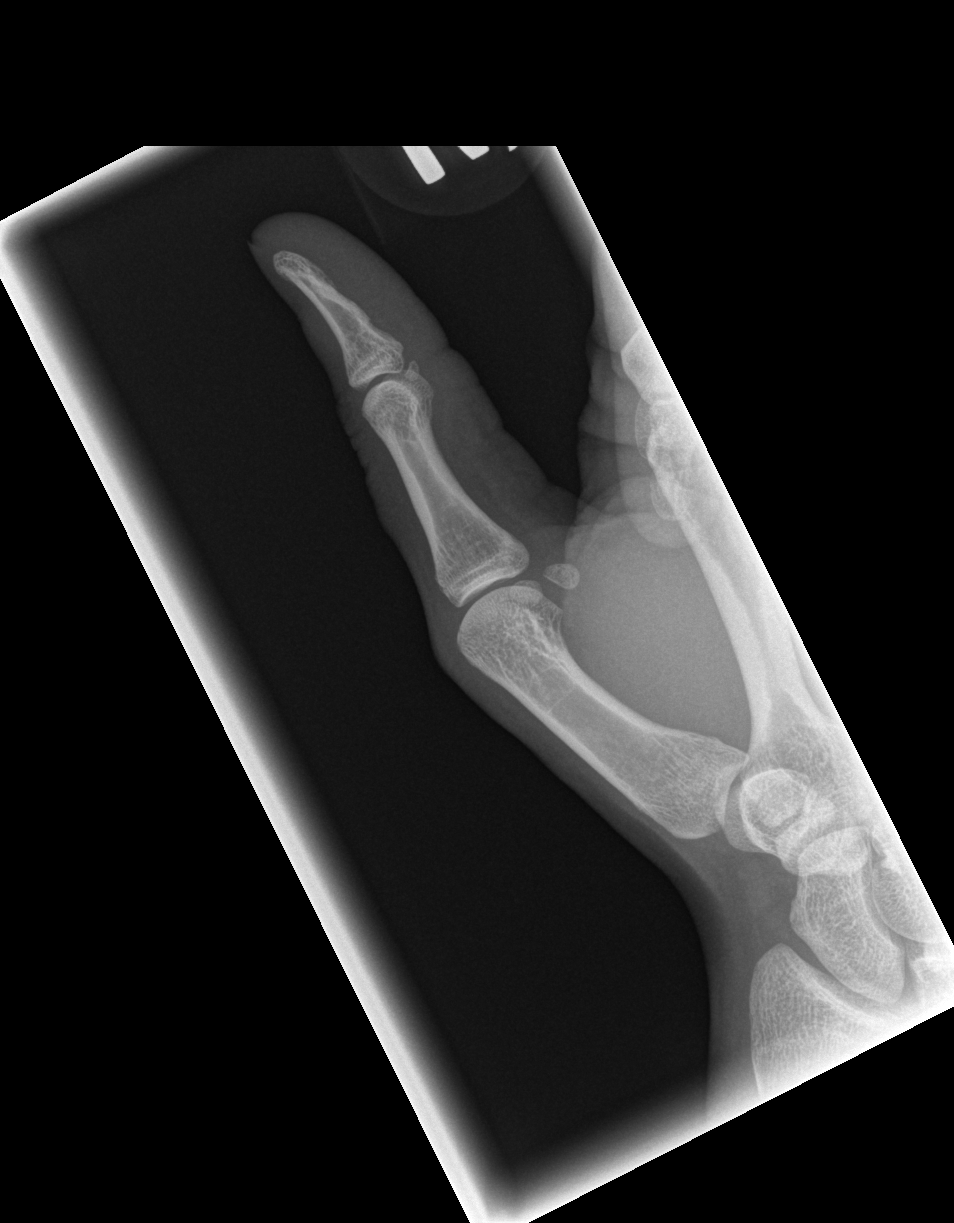

[3 of 3 positions shown; findings below may reference images not displayed]

FINDINGS: There is no evidence of fracture or dislocation. There is no
evidence of arthropathy or other focal bone abnormality. Soft
tissues are unremarkable
IMPRESSION: Negative.

## 2023-03-19 ENCOUNTER — Emergency Department (HOSPITAL_BASED_OUTPATIENT_CLINIC_OR_DEPARTMENT_OTHER)
Admission: EM | Admit: 2023-03-19 | Discharge: 2023-03-19 | Disposition: A | Discharge status: Home / Self Care | Payer: Medicaid Other | Attending: Emergency Medicine | Admitting: Emergency Medicine

## 2023-03-19 ENCOUNTER — Encounter (HOSPITAL_BASED_OUTPATIENT_CLINIC_OR_DEPARTMENT_OTHER): Payer: Self-pay | Admitting: *Deleted

## 2023-03-19 ENCOUNTER — Other Ambulatory Visit: Payer: Self-pay

## 2023-03-19 DIAGNOSIS — D649 Anemia, unspecified: Secondary | ICD-10-CM | POA: Diagnosis not present

## 2023-03-19 DIAGNOSIS — N3000 Acute cystitis without hematuria: Secondary | ICD-10-CM | POA: Insufficient documentation

## 2023-03-19 DIAGNOSIS — R300 Dysuria: Secondary | ICD-10-CM | POA: Diagnosis present

## 2023-03-19 LAB — CBC WITH DIFFERENTIAL/PLATELET
Abs Immature Granulocytes: 0.02 10*3/uL (ref 0.00–0.07)
Basophils Absolute: 0 10*3/uL (ref 0.0–0.1)
Basophils Relative: 0 %
Eosinophils Absolute: 0.1 10*3/uL (ref 0.0–0.5)
Eosinophils Relative: 1 %
HCT: 38.4 % (ref 36.0–46.0)
Hemoglobin: 11.9 g/dL — ABNORMAL LOW (ref 12.0–15.0)
Immature Granulocytes: 0 %
Lymphocytes Relative: 23 %
Lymphs Abs: 2.2 10*3/uL (ref 0.7–4.0)
MCH: 24.4 pg — ABNORMAL LOW (ref 26.0–34.0)
MCHC: 31 g/dL (ref 30.0–36.0)
MCV: 78.9 fL — ABNORMAL LOW (ref 80.0–100.0)
Monocytes Absolute: 0.8 10*3/uL (ref 0.1–1.0)
Monocytes Relative: 9 %
Neutro Abs: 6.4 10*3/uL (ref 1.7–7.7)
Neutrophils Relative %: 67 %
Platelets: 296 10*3/uL (ref 150–400)
RBC: 4.87 MIL/uL (ref 3.87–5.11)
RDW: 16.5 % — ABNORMAL HIGH (ref 11.5–15.5)
WBC: 9.6 10*3/uL (ref 4.0–10.5)
nRBC: 0 % (ref 0.0–0.2)

## 2023-03-19 LAB — URINALYSIS, ROUTINE W REFLEX MICROSCOPIC
Bilirubin Urine: NEGATIVE
Glucose, UA: NEGATIVE mg/dL
Ketones, ur: NEGATIVE mg/dL
Leukocytes,Ua: NEGATIVE
Nitrite: NEGATIVE
Protein, ur: NEGATIVE mg/dL
Specific Gravity, Urine: 1.025 (ref 1.005–1.030)
pH: 7 (ref 5.0–8.0)

## 2023-03-19 LAB — COMPREHENSIVE METABOLIC PANEL
ALT: 17 U/L (ref 0–44)
AST: 21 U/L (ref 15–41)
Albumin: 4 g/dL (ref 3.5–5.0)
Alkaline Phosphatase: 53 U/L (ref 38–126)
Anion gap: 8 (ref 5–15)
BUN: 13 mg/dL (ref 6–20)
CO2: 23 mmol/L (ref 22–32)
Calcium: 9 mg/dL (ref 8.9–10.3)
Chloride: 107 mmol/L (ref 98–111)
Creatinine, Ser: 0.85 mg/dL (ref 0.44–1.00)
GFR, Estimated: >60 mL/min (ref >60)
Glucose, Bld: 95 mg/dL (ref 70–99)
Potassium: 3.8 mmol/L (ref 3.5–5.1)
Sodium: 138 mmol/L (ref 135–145)
Total Bilirubin: 0.3 mg/dL (ref 0.3–1.2)
Total Protein: 7.5 g/dL (ref 6.5–8.1)

## 2023-03-19 LAB — URINALYSIS, MICROSCOPIC (REFLEX)

## 2023-03-19 LAB — LIPASE, BLOOD: Lipase: 26 U/L (ref 11–51)

## 2023-03-19 LAB — PREGNANCY, URINE: Preg Test, Ur: NEGATIVE

## 2023-03-19 MED ORDER — CEPHALEXIN 250 MG PO CAPS
500.0000 mg | ORAL_CAPSULE | Freq: Once | ORAL | Status: AC
  Administered 2023-03-19: 500 mg via ORAL
  Filled 2023-03-19: qty 2

## 2023-03-19 MED ORDER — CEPHALEXIN 500 MG PO CAPS: 500.0000 mg | ORAL_CAPSULE | Freq: Two times a day (BID) | ORAL | 0 refills | Status: AC

## 2023-03-19 NOTE — ED Provider Notes (Addendum)
[place] [facility] Provider Note   CSN: [id number] Arrival date & time: 03/19/23  1706     History Chief Complaint  Patient presents with   Abdominal Pain    Linda Webb is a 32 y.o. female.  Patient presents to the emergency department with complaints of abdominal pain.  Patient reports that she has been having some lower this time such as dysuria, hematuria, increased urinary frequency or urgency.  She does report that she recently tested positive on home pregnancy test.  She is not currently using any contraception.  Reports that this pregnancy was unplanned.  Does not take any medications in an effort to abort this pregnancy.  Does report that she began to experience a menstrual cycle a few days ago and noticed a large "clot" that she believes is likely the pregnancy terminating.  She denies any worsening abdominal pain, fever, vaginal discharge or drainage.   Abdominal Pain      Home Medications Prior to Admission medications   Medication Sig Start Date End Date Taking? Authorizing Provider  cephALEXin (KEFLEX) 500 MG capsule Take 1 capsule (500 mg total) by mouth 2 (two) times daily for 7 days. 03/19/23 03/26/23 Yes Smitty Knudsen, PA-C  metroNIDAZOLE (FLAGYL) 500 MG tablet Take 1 tablet (500 mg total) by mouth 2 (two) times daily. 09/04/19   Ward, Layla Maw, DO      Allergies    Patient has no known allergies.    Review of Systems   Review of Systems  Gastrointestinal:  Positive for abdominal pain.  All other systems reviewed and are negative.   Physical Exam Updated Vital Signs BP 113/79 (BP Location: Left Arm)   Pulse 92   Temp 98.1 F (36.7 C) (Oral)   Resp 18   Ht  (1.702 m)   Wt 54.4 kg   LMP 03/13/2023   SpO2 99%   Breastfeeding No   BMI 18.79 kg/m  Physical Exam Vitals and nursing note reviewed.  Constitutional:      General: She is not in acute distress.    Appearance: She is well-developed.  HENT:      Head: Normocephalic and atraumatic.  Eyes:     Conjunctiva/sclera: Conjunctivae normal.  Cardiovascular:     Rate and Rhythm: Normal rate and regular rhythm.     Heart sounds: No murmur heard. Pulmonary:     Effort: Pulmonary effort is normal. No respiratory distress.     Breath sounds: Normal breath sounds.  Abdominal:     Palpations: Abdomen is soft.     Tenderness: There is no abdominal tenderness.  Genitourinary:    Comments: Pelvic exam declined at this time. Musculoskeletal:        General: No swelling.     Cervical back: Neck supple.  Skin:    General: Skin is warm and dry.     Capillary Refill: Capillary refill takes less than 2 seconds.  Neurological:     Mental Status: She is alert.  Psychiatric:        Mood and Affect: Mood normal.     ED Results / Procedures / Treatments   Labs (all labs ordered are listed, but only abnormal results are displayed) Labs Reviewed  CBC WITH DIFFERENTIAL/PLATELET - Abnormal; Notable for the following components:      Result Value   Hemoglobin 11.9 (*)    MCV 78.9 (*)    MCH 24.4 (*)    RDW 16.5 (*)  All other components within normal limits  URINALYSIS, ROUTINE W REFLEX MICROSCOPIC - Abnormal; Notable for the following components:   APPearance HAZY (*)    Hgb urine dipstick SMALL (*)    All other components within normal limits  URINALYSIS, MICROSCOPIC (REFLEX) - Abnormal; Notable for the following components:   Bacteria, UA MANY (*)    All other components within normal limits  COMPREHENSIVE METABOLIC PANEL  LIPASE, BLOOD  PREGNANCY, URINE    EKG None  Radiology No results found.  Procedures Procedures   Medications Ordered in ED Medications  cephALEXin (KEFLEX) capsule 500 mg (500 mg Oral Given 03/19/23 1911)    ED Course/ Medical Decision Making/ A&P                           Medical Decision Making Amount and/or Complexity of Data Reviewed Labs: ordered.  Risk Prescription drug  management.   This patient presents to the ED for concern of abdominal pain.  Differential diagnosis includes urinary tract infection, pregnancy, PID, ovarian cyst, ovarian abscess   Lab Tests:  I Ordered, and personally interpreted labs.  The pertinent results include:  CBC with evidence of slight anemia with hemoglobin down to 11.9, CMP unremarkable, lipase normal, UA with evidence of UTI as there are bacteria, white blood cells, small mount of blood   Medicines ordered and prescription drug management:  I ordered medication including Keflex for UTI Reevaluation of the patient after these medicines showed that the patient unchanged I have reviewed the patients home medicines and have made adjustments as needed   Problem List / ED Course:  Patient presents to the emergency department complaints of lower abdominal pain.  She reports that she recently had a positive pregnancy test at home but reports that she had a period shortly thereafter which is in line with her typical menstrual cycle.  Patient is urine pregnancy negative on our testing today.  Denies any lower abdominal tenderness that is been worsening, fevers, vaginal discharge.  I believe that concern for retained products of conception is very low at this time.  Patient declined a vaginal exam today but does have adequate OB/GYN follow-up.  Given lower abdominal pain and low back pain, will treat for UTI which patient was agreeable with.  Patient verbalized understanding treatment plan and all return precautions.  All questions answered prior to patient discharge.  Final Clinical Impression(s) / ED Diagnoses Final diagnoses:  Acute cystitis without hematuria    Rx / DC Orders ED Discharge Orders          Ordered    cephALEXin (KEFLEX) 500 MG capsule  2 times daily        03/19/23 1858               Smitty Knudsen, PA-C 03/19/23 1914    Virgina Norfolk, DO 03/19/23 1916

## 2023-03-19 NOTE — Discharge Instructions (Addendum)
You were seen in the emergency department for abdominal pain.  Based on your evaluation and your lab results, it appears that you likely have a urinary tract infection at this time.  It also appears that he may have recently had a miscarriage given that he had a recent positive pregnancy test and then your menstrual cycle and now currently having a negative pregnancy test.  I would advise he follow-up with your OB/gynecologist for further evaluation if you are concerned but given your history it sounds like you had a complete miscarriage.  If begin to experience any worsening lower abdominal pain, significant vaginal discharge, abnormal vaginal discharge odor, or fever please return to the emergency department for immediate evaluation as this may be signs of an infection or possible retained products of conception.  Otherwise, I believe that you are safe to discharge home and follow-up with your care team as needed.

## 2023-03-19 NOTE — ED Triage Notes (Signed)
Pt is here for evaluation after she reports that she felt movement in her lower abdomen and took a pregnancy test which was positive on 4/7.  Pt began having vaginal bleeding on 4/8.  Bleeding seemed like a regular period.  Pt began feeling lower abdominal cramping and pain in her lower back this am.  Pt had light vaginal bleeding 3/8 and she also had a period in early February.  No vaginal bleeding at this time.
